# Patient Record
Sex: Female | Born: 1990 | Race: Black or African American | Hispanic: No | Marital: Single | Smoking: Former smoker
Health system: Southern US, Community
[De-identification: ages and names within clinical notes are randomized; demographics above are authoritative.]

## PROBLEM LIST (undated history)

## (undated) DIAGNOSIS — E782 Mixed hyperlipidemia: Secondary | ICD-10-CM

## (undated) DIAGNOSIS — E559 Vitamin D deficiency, unspecified: Secondary | ICD-10-CM

## (undated) DIAGNOSIS — Z9089 Acquired absence of other organs: Secondary | ICD-10-CM

## (undated) HISTORY — DX: Acquired absence of other organs: Z90.89

## (undated) HISTORY — PX: TONSILLECTOMY: SHX5217

---
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
## 2010-06-24 ENCOUNTER — Ambulatory Visit: Payer: Self-pay

## 2011-10-06 ENCOUNTER — Emergency Department: Payer: Self-pay | Admitting: Unknown Physician Specialty

## 2012-05-10 ENCOUNTER — Emergency Department: Payer: Self-pay | Admitting: Emergency Medicine

## 2012-05-10 LAB — URINALYSIS, COMPLETE
Bilirubin,UR: NEGATIVE
Glucose,UR: NEGATIVE mg/dL (ref 0–75)
Nitrite: NEGATIVE
Ph: 6 (ref 4.5–8.0)
Protein: NEGATIVE
RBC,UR: NONE SEEN /HPF (ref 0–5)
Squamous Epithelial: 1

## 2012-05-10 LAB — COMPREHENSIVE METABOLIC PANEL
Alkaline Phosphatase: 56 U/L (ref 50–136)
Bilirubin,Total: 0.1 mg/dL — ABNORMAL LOW (ref 0.2–1.0)
Calcium, Total: 9.1 mg/dL (ref 8.5–10.1)
Chloride: 105 mmol/L (ref 98–107)
Co2: 27 mmol/L (ref 21–32)
Creatinine: 0.79 mg/dL (ref 0.60–1.30)
EGFR (African American): >60
EGFR (Non-African Amer.): >60
SGPT (ALT): 24 U/L
Sodium: 140 mmol/L (ref 136–145)
Total Protein: 7.9 g/dL (ref 6.4–8.2)

## 2012-05-10 LAB — CBC
HCT: 37.4 % (ref 35.0–47.0)
HGB: 12.5 g/dL (ref 12.0–16.0)
MCH: 30.3 pg (ref 26.0–34.0)
RBC: 4.12 10*6/uL (ref 3.80–5.20)
WBC: 6.7 10*3/uL (ref 3.6–11.0)

## 2012-05-10 LAB — HCG, QUANTITATIVE, PREGNANCY: Beta Hcg, Quant.: 25678 m[IU]/mL — ABNORMAL HIGH

## 2012-08-24 ENCOUNTER — Emergency Department: Payer: Self-pay | Admitting: Emergency Medicine

## 2012-08-24 LAB — URINALYSIS, COMPLETE
Bilirubin,UR: NEGATIVE
Glucose,UR: NEGATIVE mg/dL (ref 0–75)
Nitrite: POSITIVE
Protein: 100
RBC,UR: 33 /HPF (ref 0–5)
Specific Gravity: 1.023 (ref 1.003–1.030)
Squamous Epithelial: 14
WBC UR: 1598 /HPF (ref 0–5)

## 2012-08-24 LAB — COMPREHENSIVE METABOLIC PANEL
Albumin: 3 g/dL — ABNORMAL LOW (ref 3.4–5.0)
Alkaline Phosphatase: 70 U/L (ref 50–136)
Bilirubin,Total: 0.5 mg/dL (ref 0.2–1.0)
Calcium, Total: 9.3 mg/dL (ref 8.5–10.1)
Creatinine: 0.73 mg/dL (ref 0.60–1.30)
Glucose: 79 mg/dL (ref 65–99)
Osmolality: 266 (ref 275–301)
Potassium: 3.4 mmol/L — ABNORMAL LOW (ref 3.5–5.1)
SGOT(AST): 49 U/L — ABNORMAL HIGH (ref 15–37)
SGPT (ALT): 63 U/L (ref 12–78)
Sodium: 135 mmol/L — ABNORMAL LOW (ref 136–145)

## 2012-08-24 LAB — CBC WITH DIFFERENTIAL/PLATELET
Eosinophil %: 0.2 %
HCT: 36.6 % (ref 35.0–47.0)
Lymphocyte #: 0.8 10*3/uL — ABNORMAL LOW (ref 1.0–3.6)
Lymphocyte %: 8 %
MCH: 30 pg (ref 26.0–34.0)
MCV: 89 fL (ref 80–100)
Monocyte %: 9.7 %
Neutrophil #: 7.9 10*3/uL — ABNORMAL HIGH (ref 1.4–6.5)
Platelet: 316 10*3/uL (ref 150–440)
RBC: 4.1 10*6/uL (ref 3.80–5.20)
RDW: 13.3 % (ref 11.5–14.5)
WBC: 9.7 10*3/uL (ref 3.6–11.0)

## 2012-08-26 LAB — URINE CULTURE

## 2012-12-01 ENCOUNTER — Observation Stay: Payer: Self-pay | Admitting: Obstetrics and Gynecology

## 2012-12-17 ENCOUNTER — Inpatient Hospital Stay: Payer: Self-pay | Admitting: Obstetrics & Gynecology

## 2012-12-17 LAB — CBC WITH DIFFERENTIAL/PLATELET
Basophil %: 0.3 %
Eosinophil #: 0.1 10*3/uL (ref 0.0–0.7)
Eosinophil %: 1 %
HCT: 36.5 % (ref 35.0–47.0)
Lymphocyte %: 24 %
MCHC: 33.4 g/dL (ref 32.0–36.0)
MCV: 91 fL (ref 80–100)
Monocyte #: 0.7 x10 3/mm (ref 0.2–0.9)
Neutrophil #: 4 10*3/uL (ref 1.4–6.5)
Neutrophil %: 63.8 %
RBC: 4.01 10*6/uL (ref 3.80–5.20)
WBC: 6.2 10*3/uL (ref 3.6–11.0)

## 2015-04-09 ENCOUNTER — Emergency Department
Admission: EM | Admit: 2015-04-09 | Discharge: 2015-04-09 | Disposition: A | Discharge status: Home / Self Care | Payer: Self-pay | Attending: Emergency Medicine | Admitting: Emergency Medicine

## 2015-04-09 DIAGNOSIS — Z792 Long term (current) use of antibiotics: Secondary | ICD-10-CM | POA: Insufficient documentation

## 2015-04-09 DIAGNOSIS — L02413 Cutaneous abscess of right upper limb: Secondary | ICD-10-CM | POA: Insufficient documentation

## 2015-04-09 MED ORDER — SULFAMETHOXAZOLE-TRIMETHOPRIM 800-160 MG PO TABS: 1.0000 | ORAL_TABLET | Freq: Two times a day (BID) | ORAL | Status: DC

## 2015-04-09 MED ORDER — TRAMADOL HCL 50 MG PO TABS: 50.0000 mg | ORAL_TABLET | Freq: Four times a day (QID) | ORAL | Status: AC | PRN

## 2015-04-09 NOTE — ED Notes (Signed)
Pt reports that she noticed an abscess on her right forearm. She thinks that it may have started out as a bug bite. It is open and has clear drainage.

## 2015-04-09 NOTE — ED Provider Notes (Signed)
[facility] Emergency Department Provider Note   ____________________________________________  Time seen: 1210  I have reviewed the triage vital signs and the nursing notes.   HISTORY  Chief Complaint Abscess and Insect Bite     HPI Mariah Watson is a 24 y.o. female who presents to the emergency department with an abscess to the right mid forearm. She reports that it progressively worsened over the week and this morning it opened and has had purulent drainage. She states she has had a subjective fever, chills, and increasing pain in the forearm. She denies previous history of MRSA or other skin infections.   No past medical history on file.  There are no active problems to display for this patient.   No past surgical history on file.  Current Outpatient Rx  Name  Route  Sig  Dispense  Refill  . sulfamethoxazole-trimethoprim (BACTRIM DS) 800-160 MG per tablet   Oral   Take 1 tablet by mouth 2 (two) times daily.   20 tablet   0   . traMADol (ULTRAM) 50 MG tablet   Oral   Take 1 tablet (50 mg total) by mouth every 6 (six) hours as needed.   12 tablet   0     Allergies Shellfish allergy  No family history on file.  Social History History  Substance Use Topics  . Smoking status: Not on file  . Smokeless tobacco: Not on file  . Alcohol Use: Not on file    Review of Systems  Constitutional: Positive for fever. Eyes: Negative for visual changes. ENT: Negative for sore throat. Cardiovascular: Negative for chest pain. Respiratory: Negative for shortness of breath. Gastrointestinal: Negative for abdominal pain, positive for nausea however no vomiting and diarrhea. Genitourinary: Negative for dysuria. Musculoskeletal: Negative for back pain. Skin: Negative for rash. Neurological: Negative for headaches, focal weakness or numbness.  10-point ROS otherwise negative.  ____________________________________________   PHYSICAL  EXAM:  VITAL SIGNS: ED Triage Vitals  Enc Vitals Group     BP 04/09/15 1154 134/74 mmHg     Pulse Rate 04/09/15 1154 87     Resp 04/09/15 1154 18     Temp 04/09/15 1154 99.1 F (37.3 C)     Temp Source 04/09/15 1154 Oral     SpO2 04/09/15 1154 98 %     Weight 04/09/15 1154 230 lb (104.327 kg)     Height 04/09/15 1154 5\' 7"  (1.702 m)     Head Cir --      Peak Flow --      Pain Score 04/09/15 1154 2     Pain Loc --      Pain Edu? --      Excl. in GC? --     Constitutional: Alert and oriented. Well appearing and in no distress. Eyes: Conjunctivae are normal. PERRL. Normal extraocular movements. ENT   Head: Normocephalic and atraumatic.   Nose: No congestion/rhinnorhea.   Mouth/Throat: Mucous membranes are moist.   Neck: No stridor. Respiratory: Normal respiratory effort without tachypnea nor retractions. Musculoskeletal: Full range of motion in all extremities. Neurologic:  Normal speech and language. No gross focal neurologic deficits are appreciated. Speech is normal. No gait instability. Skin: 2 cm in diameter ulceration to the right mid forearm with purulent drainage. . No rash noted. Psychiatric: Mood and affect are normal. Speech and behavior are normal. Patient exhibits appropriate insight and judgment.  ____________________________________________    LABS (pertinent positives/negatives)    ____________________________________________   EKG  ____________________________________________    RADIOLOGY    ____________________________________________   PROCEDURES  Procedure(s) performed: None  Critical Care performed: No  ____________________________________________   INITIAL IMPRESSION / ASSESSMENT AND PLAN / ED COURSE  Pertinent labs & imaging results that were available during my care of the patient were reviewed by me and considered in my medical decision making (see chart for details).  The patient was encouraged to use a warm  compress over the area 4 times a day to encourage drainage. She was encouraged to finish all the antibiotics even if she feels like the area has improved. She was instructed to return to the emergency department in 2 days for symptoms that are not improving with medication. She is to follow up with her primary care provider as needed.  ____________________________________________   FINAL CLINICAL IMPRESSION(S) / ED DIAGNOSES  Final diagnoses:  Abscess of right arm     Mariah PesterCari B Mata Rowen, FNP 04/09/15 1417  Loleta Roseory Forbach, MD 04/09/15 [phone]

## 2015-04-09 NOTE — Discharge Instructions (Signed)
Abscess °An abscess (boil or furuncle) is an infected area on or under the skin. This area is filled with yellowish-white fluid (pus) and other material (debris). °HOME CARE  °· Only take medicines as told by your doctor. °· If you were given antibiotic medicine, take it as directed. Finish the medicine even if you start to feel better. °· If gauze is used, follow your doctor's directions for changing the gauze. °· To avoid spreading the infection: °¨ Keep your abscess covered with a bandage. °¨ Wash your hands well. °¨ Do not share personal care items, towels, or whirlpools with others. °¨ Avoid skin contact with others. °· Keep your skin and clothes clean around the abscess. °· Keep all doctor visits as told. °GET HELP RIGHT AWAY IF:  °· You have more pain, puffiness (swelling), or redness in the wound site. °· You have more fluid or blood coming from the wound site. °· You have muscle aches, chills, or you feel sick. °· You have a fever. °MAKE SURE YOU:  °· Understand these instructions. °· Will watch your condition. °· Will get help right away if you are not doing well or get worse. °Document Released: 05/08/2008 Document Revised: 05/21/2012 Document Reviewed: 02/02/2012 °Exit[employer]® Patient Information ©2015 [employer]. This information is not intended to replace advice given to you by your health care provider. Make sure you discuss any questions you have with your health care provider. ° °

## 2015-04-09 NOTE — ED Notes (Signed)
Pt informed to return if any life threatening symptoms occur.  

## 2015-04-09 NOTE — ED Notes (Signed)
Pt c/o swollen area to the right FA since the beginning of the week, states yesterday it opened and drained.."i think a spider bit me"..Marland Kitchen

## 2015-04-13 NOTE — H&P (Signed)
L&D Evaluation:  History:   HPI 24 yo G1 at 6620w6d gestational age by LMP consistent with a 12 weeks ultrasound whose pregnancy has been uncomplicated.  She noted a gush of "yellow" fluid at 2pm today.  She notes positive fetal movement, denies vaginal bleeding.  She is contracting.   A+, VZI, RI, GBS+, RPR NR    Patient's Medical History No Chronic Illness    Patient's Surgical History tonsillectomy as child    Medications Pre Natal Vitamins    Allergies NKDA    Social History none    Family History Non-Contributory   ROS:   ROS All systems were reviewed.  HEENT, CNS, GI, GU, Respiratory, CV, Renal and Musculoskeletal systems were found to be normal., unless noted in HPI   Exam:   Vital Signs stable  all within normal range    Urine Protein trace    General mild distress with contractions    Mental Status clear    Chest clear    Heart normal sinus rhythm    Abdomen gravid, tender with contractions    Estimated Fetal Weight 7 pounds    Back no CVAT    Edema no edema    Pelvic 4cm per RN    Mebranes Ruptured    Description clear    FHT normal rate with no decels    FHT Description 125/mod var/+Accels/no decels    Ucx 3-4 q 10 min    Other Bedside ultrasound shows fetus in cephalic presentation   Impression:   Impression active labor, SROM   Plan:   Plan EFM/NST, monitor contractions and for cervical change, antibiotics for GBBS prophylaxis, fluids    Comments - Admit for SROM - Will allow to labor without augmentation initially.  Will augment, if necessary - Amp for GBS+ - IVF, clears, cbc   Electronic Signatures: Conard NovakJackson, Kishana Battey D (MD)  (Signed 14-Jan-14 16:52)  Authored: L&D Evaluation   Last Updated: 14-Jan-14 16:52 by Conard NovakJackson, Vaiden Adames D (MD)

## 2015-04-13 NOTE — H&P (Signed)
L&D Evaluation:  History Expanded:   HPI 24yo G1P0  [facility] who presents with a few contractions at home she thought she had a gush and she lost her nmucous plug this morning. she is here to be evaluated.    Gravida 1    Term 0    PreTerm 0    Abortion 0    Living 0    Blood Type (Maternal) A positive    Group B Strep Results Maternal (Result >5wks must be treated as unknown) unknown/result > 5 weeks ago    Maternal HIV Negative    Maternal Syphilis Ab Nonreactive    Maternal Varicella Immune    Rubella Results (Maternal) immune    Maternal T-Dap Immune    [facility]    Presents with contractions    Patient's Medical History No Chronic Illness    Patient's Surgical History none    Social History none    Family History Non-Contributory   ROS:   ROS All systems were reviewed.  HEENT, CNS, GI, GU, Respiratory, CV, Renal and Musculoskeletal systems were found to be normal.   Exam:   Vital Signs stable    Urine Protein not completed    General no apparent distress    Mental Status clear    Chest clear    Heart normal sinus rhythm    Abdomen gravid, non-tender    Estimated Fetal Weight Average for gestational age    Back no CVAT    Edema no edema    Pelvic no external lesions    Ucx absent    Skin dry    Lymph no lymphadenopathy   Impression:   Impression contractions   Plan:   Follow Up Appointment need to schedule   Electronic Signatures: Adria DevonKlett, Carmeline Kowal (MD)  (Signed 29-Dec-13 14:57)  Authored: L&D Evaluation   Last Updated: 29-Dec-13 14:57 by Adria DevonKlett, Ula Couvillon (MD)

## 2015-11-18 ENCOUNTER — Encounter (HOSPITAL_COMMUNITY): Payer: Self-pay | Admitting: Emergency Medicine

## 2015-11-18 ENCOUNTER — Emergency Department (HOSPITAL_COMMUNITY)
Admission: EM | Admit: 2015-11-18 | Discharge: 2015-11-18 | Disposition: A | Discharge status: Home / Self Care | Payer: Self-pay | Attending: Emergency Medicine | Admitting: Emergency Medicine

## 2015-11-18 DIAGNOSIS — N898 Other specified noninflammatory disorders of vagina: Secondary | ICD-10-CM | POA: Insufficient documentation

## 2015-11-18 DIAGNOSIS — Z3202 Encounter for pregnancy test, result negative: Secondary | ICD-10-CM | POA: Insufficient documentation

## 2015-11-18 DIAGNOSIS — F17200 Nicotine dependence, unspecified, uncomplicated: Secondary | ICD-10-CM | POA: Insufficient documentation

## 2015-11-18 DIAGNOSIS — R300 Dysuria: Secondary | ICD-10-CM | POA: Insufficient documentation

## 2015-11-18 LAB — URINE MICROSCOPIC-ADD ON

## 2015-11-18 LAB — URINALYSIS, ROUTINE W REFLEX MICROSCOPIC
Bilirubin Urine: NEGATIVE
Glucose, UA: NEGATIVE mg/dL
Ketones, ur: 15 mg/dL — AB
NITRITE: NEGATIVE
PROTEIN: 30 mg/dL — AB
Specific Gravity, Urine: 1.04 — ABNORMAL HIGH (ref 1.005–1.030)
pH: 7 (ref 5.0–8.0)

## 2015-11-18 LAB — WET PREP, GENITAL
CLUE CELLS WET PREP: NONE SEEN
Sperm: NONE SEEN
Trich, Wet Prep: NONE SEEN
YEAST WET PREP: NONE SEEN

## 2015-11-18 LAB — POC URINE PREG, ED: PREG TEST UR: NEGATIVE

## 2015-11-18 MED ORDER — CEPHALEXIN 500 MG PO CAPS: 500.0000 mg | ORAL_CAPSULE | Freq: Two times a day (BID) | ORAL | Status: DC

## 2015-11-18 NOTE — Discharge Instructions (Signed)

## 2015-11-18 NOTE — ED Provider Notes (Signed)
CSN: [id number]     Arrival date & time 11/18/15  1825 History  By signing my name below, I, Mariah Watson, attest that this documentation has been prepared under the direction and in the presence of non-physician practitioner, Roxy Horseman, PA-C. Electronically Signed: Freida Watson, Scribe. 11/18/2015. 8:37 PM.    Chief Complaint  Patient presents with  . Vaginal Discharge  . Dysuria    The history is provided by the patient. No language interpreter was used.     HPI Comments:  MERLY HINKSON is a 24 y.o. female who presents to the Emergency Department complaining of vaginal discharge for ~ 2 days. Pt reports associated dysuria and mild lower abdominal pain.  She denies h/o similar symptoms, recent new sexual partners, and h/o abdominal surgeries. No alleviating factors noted. Pt is currently on her period.   History reviewed. No pertinent past medical history. History reviewed. No pertinent past surgical history. History reviewed. No pertinent family history. Social History  Substance Use Topics  . Smoking status: Current Every Day Smoker  . Smokeless tobacco: None  . Alcohol Use: Yes   OB History    No data available     Review of Systems  Constitutional: Negative for fever and chills.  Respiratory: Negative for shortness of breath.   Cardiovascular: Negative for chest pain.  Genitourinary: Positive for dysuria and vaginal discharge.    Allergies  Shellfish allergy  Home Medications   Prior to Admission medications   Medication Sig Start Date End Date Taking? Authorizing Provider  acetaminophen (TYLENOL) 500 MG tablet Take 500 mg by mouth every 6 (six) hours as needed for mild pain.   Yes Historical Provider, MD  sulfamethoxazole-trimethoprim (BACTRIM DS) 800-160 MG per tablet Take 1 tablet by mouth 2 (two) times daily. Patient not taking: Reported on 11/18/2015 04/09/15   Chinita Pester, FNP  traMADol (ULTRAM) 50 MG tablet Take 1 tablet (50 mg total) by mouth  every 6 (six) hours as needed. Patient not taking: Reported on 11/18/2015 04/09/15 04/08/16  Cari B Triplett, FNP   BP 136/82 mmHg  Pulse 75  Temp(Src) 98.9 F (37.2 C) (Oral)  Resp 18  SpO2 100% Physical Exam  Constitutional: She is oriented to person, place, and time. She appears well-developed and well-nourished. No distress.  HENT:  Head: Normocephalic and atraumatic.  Eyes: Conjunctivae are normal.  Cardiovascular: Normal rate.   Pulmonary/Chest: Effort normal.  Abdominal: Soft. She exhibits no distension and no mass. There is no tenderness. There is no rebound and no guarding.  Genitourinary:  Pelvic exam chaperoned by female ER tech, no right or left adnexal tenderness, no uterine tenderness, no vaginal discharge, mild bleeding, no CMT or friability, no foreign body, no injury to the external genitalia, no other significant findings    Neurological: She is alert and oriented to person, place, and time.  Skin: Skin is warm and dry.  Psychiatric: She has a normal mood and affect.  Nursing note and vitals reviewed.   ED Course  Procedures   DIAGNOSTIC STUDIES:  Oxygen Saturation is 100% on RA, normal by my interpretation.    COORDINATION OF CARE:  7:43 PM Will order labs. Discussed treatment plan with pt at bedside and pt agreed to plan.    MDM   Final diagnoses:  Dysuria    Wet prep remarkable for white blood cells, no clue cells. Patient is on her period. Low suspicion for STD per patient history. Will treat for UTI with Keflex. Recommend follow-up  with PCP. Patient understands and agrees with the plan. She is stable and ready for discharge.  I personally performed the services described in this documentation, which was scribed in my presence. The recorded information has been reviewed and is accurate.      Roxy HorsemanRobert Mirielle Byrum, PA-C 11/18/15 2119  Benjiman CoreNathan Pickering, MD 11/18/15 2352

## 2015-11-18 NOTE — ED Notes (Signed)
Pt sts yellow vaginal discharge and dysuria; pt sts currently on period

## 2015-11-19 LAB — GC/CHLAMYDIA PROBE AMP (~~LOC~~) NOT AT ARMC
Chlamydia: NEGATIVE
NEISSERIA GONORRHEA: NEGATIVE

## 2017-03-15 DIAGNOSIS — E669 Obesity, unspecified: Secondary | ICD-10-CM | POA: Insufficient documentation

## 2017-03-15 DIAGNOSIS — E010 Iodine-deficiency related diffuse (endemic) goiter: Secondary | ICD-10-CM | POA: Insufficient documentation

## 2018-06-07 ENCOUNTER — Other Ambulatory Visit: Payer: Self-pay

## 2018-06-07 ENCOUNTER — Encounter (HOSPITAL_COMMUNITY): Payer: Self-pay | Admitting: Emergency Medicine

## 2018-06-07 ENCOUNTER — Emergency Department (HOSPITAL_COMMUNITY): Payer: 59

## 2018-06-07 ENCOUNTER — Emergency Department (HOSPITAL_COMMUNITY)
Admission: EM | Admit: 2018-06-07 | Discharge: 2018-06-07 | Disposition: A | Discharge status: Home / Self Care | Payer: 59 | Attending: Emergency Medicine | Admitting: Emergency Medicine

## 2018-06-07 DIAGNOSIS — F17210 Nicotine dependence, cigarettes, uncomplicated: Secondary | ICD-10-CM | POA: Insufficient documentation

## 2018-06-07 DIAGNOSIS — Z79899 Other long term (current) drug therapy: Secondary | ICD-10-CM | POA: Diagnosis not present

## 2018-06-07 DIAGNOSIS — N938 Other specified abnormal uterine and vaginal bleeding: Secondary | ICD-10-CM | POA: Diagnosis not present

## 2018-06-07 DIAGNOSIS — N939 Abnormal uterine and vaginal bleeding, unspecified: Secondary | ICD-10-CM

## 2018-06-07 DIAGNOSIS — R102 Pelvic and perineal pain: Secondary | ICD-10-CM

## 2018-06-07 LAB — CBC WITH DIFFERENTIAL/PLATELET
Abs Immature Granulocytes: 0 10*3/uL (ref 0.0–0.1)
BASOS ABS: 0 10*3/uL (ref 0.0–0.1)
Basophils Relative: 1 %
EOS ABS: 0.1 10*3/uL (ref 0.0–0.7)
Eosinophils Relative: 3 %
HCT: 39.2 % (ref 36.0–46.0)
Hemoglobin: 12.3 g/dL (ref 12.0–15.0)
Immature Granulocytes: 0 %
Lymphocytes Relative: 30 %
Lymphs Abs: 1.6 10*3/uL (ref 0.7–4.0)
MCH: 29.3 pg (ref 26.0–34.0)
MCHC: 31.4 g/dL (ref 30.0–36.0)
MCV: 93.3 fL (ref 78.0–100.0)
MONOS PCT: 6 %
Monocytes Absolute: 0.3 10*3/uL (ref 0.1–1.0)
NEUTROS ABS: 3.3 10*3/uL (ref 1.7–7.7)
Neutrophils Relative %: 60 %
PLATELETS: 395 10*3/uL (ref 150–400)
RBC: 4.2 MIL/uL (ref 3.87–5.11)
RDW: 13.3 % (ref 11.5–15.5)
WBC: 5.4 10*3/uL (ref 4.0–10.5)

## 2018-06-07 LAB — BASIC METABOLIC PANEL
Anion gap: 6 (ref 5–15)
BUN: 6 mg/dL (ref 6–20)
CO2: 28 mmol/L (ref 22–32)
CREATININE: 0.82 mg/dL (ref 0.44–1.00)
Calcium: 9.2 mg/dL (ref 8.9–10.3)
Chloride: 107 mmol/L (ref 98–111)
GFR calc Af Amer: >60 mL/min (ref >60)
GLUCOSE: 100 mg/dL — AB (ref 70–99)
Potassium: 4.3 mmol/L (ref 3.5–5.1)
SODIUM: 141 mmol/L (ref 135–145)

## 2018-06-07 LAB — WET PREP, GENITAL
Clue Cells Wet Prep HPF POC: NONE SEEN
SPERM: NONE SEEN
Trich, Wet Prep: NONE SEEN
Yeast Wet Prep HPF POC: NONE SEEN

## 2018-06-07 LAB — ABO/RH: ABO/RH(D): A POS

## 2018-06-07 LAB — I-STAT BETA HCG BLOOD, ED (MC, WL, AP ONLY): I-stat hCG, quantitative: 84.7 m[IU]/mL — ABNORMAL HIGH (ref <5)

## 2018-06-07 MED ORDER — KETOROLAC TROMETHAMINE 30 MG/ML IJ SOLN
30.0000 mg | Freq: Once | INTRAMUSCULAR | Status: AC
  Administered 2018-06-07: 30 mg via INTRAVENOUS
  Filled 2018-06-07: qty 1

## 2018-06-07 MED ORDER — KETOROLAC TROMETHAMINE 60 MG/2ML IM SOLN
30.0000 mg | Freq: Once | INTRAMUSCULAR | Status: DC
  Filled 2018-06-07: qty 2

## 2018-06-07 NOTE — ED Notes (Signed)
Patient verbalizes understanding of discharge instructions. Opportunity for questioning and answers were provided. Armband removed by staff, pt discharged from ED.  

## 2018-06-07 NOTE — Discharge Instructions (Addendum)
Motrin and Tylenol as needed as directed for pain. Follow-up with OB/GYN, referral given.  Return to the emergency room for fevers, worsening pain, worsening bleeding.

## 2018-06-07 NOTE — ED Triage Notes (Signed)
Patient to ED c/o persistent vaginal bleeding with clots following abortion (pill) on 05/22/18. Patient endorses intermittent lower abdominal cramping (none at this time). States she is still soaking 3 pads per day. States she has been bleeding for 2 weeks.

## 2018-06-07 NOTE — ED Provider Notes (Signed)
Patient placed in Quick Look pathway, seen and evaluated   Chief Complaint: heavy vaginal bleeding  HPI:   [name] is a 27 y.o. G2 P1 who presents to the ED with heavy vaginal bleeding that started 2 weeks ago. Patient reports [facility] [place]  [facility]/[place] for medical abortion and had Cytotec. Patient started bleeding the same day and bleeding was heavy. Patient reports she has continued to bleed since that time with clots. She is using 3 pads per day. Patient was to have follow up this week but did not go. .   ROS: GI: abdominal pain and cramping  GU; Vaginal bleeding.   Physical Exam:  BP 116/63 (BP Location: Right Arm)   Pulse (!) 104   Temp (!) 97.4 F (36.3 C) (Oral)   Resp 16   Ht 5\' 7"  (1.702 m)   Wt 99.8 kg (220 lb)   LMP 04/03/2018 (Exact Date) Comment: recent abortion 6/19  SpO2 99%   BMI 34.46 kg/m    Gen: No distress  Neuro: Awake and Alert  Skin: Warm and dry    Initiation of care has begun. The patient has been counseled on the process, plan, and necessity for staying for the completion/evaluation, and the remainder of the medical screening examination    Janne Napoleoneese, Tiernan Suto M, NP 06/07/18 1442    Jacalyn LefevreHaviland, Julie, MD 06/07/18 1515

## 2018-06-07 NOTE — ED Provider Notes (Signed)
[facility] EMERGENCY DEPARTMENT Provider Note   CSN: [id number] Arrival date & time: 06/07/18  1420     History   Chief Complaint Chief Complaint  Patient presents with  . Vaginal Bleeding    HPI Mariah Watson is a 27 y.o. female.  27 year old female presents with complaint of pelvic cramping and vaginal bleeding.  Patient states that she took Cytotec on May 22, 2018 as prescribed by [facility] to induce an abortion.  Last menstrual cycle Apr 03, 2018, reported to be a normal cycle, G2, P1. [facility] [redacted]w[redacted]d. Patient states that since taking the pill on June 19 she has had daily bleeding with clots and right lower abdominal cramping.  Patient reports using 3 large pads daily.  Patient came to the ER today due to the duration of her bleeding and cramping, symptoms are not any worse today than they have been over the past 3 weeks.  No history of anemia, denies feeling weak, dizzy, lightheaded.  No other complaints or concerns.     History reviewed. No pertinent past medical history.  There are no active problems to display for this patient.   History reviewed. No pertinent surgical history.   OB History   None      Home Medications    Prior to Admission medications   Medication Sig Start Date End Date Taking? Authorizing Provider  acetaminophen (TYLENOL) 500 MG tablet Take 500 mg by mouth every 6 (six) hours as needed for mild pain.   Yes [provider]  ibuprofen (ADVIL,MOTRIN) 800 MG tablet Take 800 mg by mouth every 8 (eight) hours as needed for pain. for pain 05/28/18  Yes [provider]  oxyCODONE-acetaminophen (PERCOCET/ROXICET) 5-325 MG tablet Take 1-2 tablets by mouth every 4 (four) hours as needed. for pain 05/28/18  Yes [provider]  promethazine (PHENERGAN) 25 MG tablet Take 25 mg by mouth every 6 (six) hours as needed for nausea. for nausea 05/28/18  Yes [provider]  cephALEXin (KEFLEX) 500 MG capsule Take 1 capsule (500 mg total) by mouth 2 (two) times daily. Patient not taking: Reported on 06/07/2018 11/18/15   Roxy Horseman, PA-C  sulfamethoxazole-trimethoprim (BACTRIM DS) 800-160 MG per tablet Take 1 tablet by mouth 2 (two) times daily. Patient not taking: Reported on 11/18/2015 04/09/15   Chinita Pester, FNP    Family History No family history on file.  Social History Social History   Tobacco Use  . Smoking status: Current Every Day Smoker  . Smokeless tobacco: Never Used  Substance Use Topics  . Alcohol use: Yes  . Drug use: No     Allergies   Shellfish allergy   Review of Systems Review of Systems  Constitutional: Negative for chills and fever.  Gastrointestinal: Negative for constipation, diarrhea, nausea and vomiting.  Genitourinary: Positive for pelvic pain and vaginal bleeding. Negative for dysuria, frequency and urgency.  Musculoskeletal: Negative for back pain.  Skin: Negative for rash and wound.  Allergic/Immunologic: Negative for immunocompromised state.  Neurological: Negative for dizziness, weakness and light-headedness.  Hematological: Does not bruise/bleed easily.  Psychiatric/Behavioral: Negative for confusion.  All other systems reviewed and are negative.    Physical Exam Updated Vital Signs BP 115/76   Pulse 74   Temp (!) 97.4 F (36.3 C) (Oral)   Resp 14   Ht 5\' 7"  (1.702 m)   Wt 99.8 kg (220 lb)   LMP 04/03/2018 (Exact Date) Comment:  recent abortion 6/19  SpO2 100%   BMI 34.46 kg/m   Physical Exam  Constitutional: She is oriented to person, place, and time. She appears well-developed and well-nourished.  HENT:  Head: Normocephalic and atraumatic.  Cardiovascular: Normal rate, regular rhythm, normal heart sounds and intact distal pulses.  No murmur heard. Pulmonary/Chest: Effort normal and breath sounds normal. No respiratory distress.  Abdominal: Soft. She exhibits no  distension. There is tenderness.  Genitourinary: Cervix exhibits no motion tenderness, no discharge and no friability. Right adnexum displays tenderness. Right adnexum displays no mass and no fullness. Left adnexum displays no mass, no tenderness and no fullness. There is bleeding in the vagina.  Genitourinary Comments: Mild amount of bleeding, os open to fingertip, right adnexa moderately tender. CNA assisted with exam.  Neurological: She is alert and oriented to person, place, and time.  Skin: Skin is warm and dry. Capillary refill takes less than 2 seconds. No rash noted.  Psychiatric: She has a normal mood and affect. Her behavior is normal.  Nursing note and vitals reviewed.    ED Treatments / Results  Labs (all labs ordered are listed, but only abnormal results are displayed) Labs Reviewed  WET PREP, GENITAL - Abnormal; Notable for the following components:      Result Value   WBC, Wet Prep HPF POC MANY (*)    All other components within normal limits  BASIC METABOLIC PANEL - Abnormal; Notable for the following components:   Glucose, Bld 100 (*)    All other components within normal limits  I-STAT BETA HCG BLOOD, ED ([facility], [facility], [facility] ONLY) - Abnormal; Notable for the following components:   I-stat hCG, quantitative 84.7 (*)    All other components within normal limits  CBC WITH DIFFERENTIAL/PLATELET  ABO/RH  GC/CHLAMYDIA PROBE AMP ([place]) NOT AT [facility]    EKG None  Radiology US Pelvic Complete W Transvaginal And Torsion R/o  Result Date: 06/07/2018 CLINICAL DATA:  Initial evaluation for acute pelvic pain. EXAM: TRANSABDOMINAL AND TRANSVAGINAL ULTRASOUND OF PELVIS DOPPLER ULTRASOUND OF OVARIES TECHNIQUE: Both transabdominal and transvaginal ultrasound examinations of the pelvis were performed. Transabdominal technique was performed for global imaging of the pelvis including uterus, ovaries, adnexal regions, and pelvic cul-de-sac. It was necessary to proceed with endovaginal exam  following the transabdominal exam to visualize the uterus, endometrium, and ovaries. Color and duplex Doppler ultrasound was utilized to evaluate blood flow to the ovaries. COMPARISON:  None. FINDINGS: Uterus Measurements: 9.3 x 5.0 x 6.5 cm. No fibroids or other mass visualized. Endometrium Thickness: 9.4 mm.  No focal abnormality visualized. Right ovary Measurements: 3.3 x 1.7 x 1.7 cm. Normal appearance/no adnexal mass. Left ovary Measurements: 3.0 x 1.6 x 2.1 cm. Normal appearance/no adnexal mass. Pulsed Doppler evaluation of both ovaries demonstrates normal low-resistance arterial and venous waveforms. Other findings No abnormal free fluid. IMPRESSION: Normal pelvic ultrasound. No evidence for torsion or other acute abnormality. Electronically Signed   By: Rise Mu M.D.   On: 06/07/2018 16:59    Procedures Procedures (including critical care time)  Medications Ordered in ED Medications  ketorolac (TORADOL) 30 MG/ML injection 30 mg (30 mg Intravenous Given 06/07/18 1930)     Initial Impression / Assessment and Plan / ED Course  I have reviewed the triage vital signs and the nursing notes.  Pertinent labs & imaging results that were available during my care of the patient were reviewed by me and considered in my medical decision making (see chart for details).  Clinical Course as of Jun 07 1941  Fri Jun 07, 2018  1842 Discussed case with Dr. Shawnie PonsPratt, OB/GYN on call, reviewed US report including endometrium 9.624mm, quant 84.7. Per Dr. Shawnie PonsPratt, this is normal, patient can follow up outpatient.   [LM]  [id number] 27 year old female presents with complaint of pelvic cramping, more so on the right side with vaginal bleeding x3 weeks after taking Cytotec 2 induce an abortion at 6 weeks and 7 days pregnant per patient. On exam, right adnexal tenderness, os open to finger tip, mild amount of bleeding. CBC is normal- no anemia, CMP normal, quant elevated at 84.7. Rh pending. US is unremarkable.  Discussed with Ob, patient may be dc to follow up outpatient. Patient will be held until Rh is complete.    [LM]    Clinical Course User Index [LM] Jeannie FendMurphy, Laura A, PA-C    Final Clinical Impressions(s) / ED Diagnoses   Final diagnoses:  Pelvic pain  Vaginal bleeding    ED Discharge Orders    None       Jeannie FendMurphy, Laura A, PA-C 06/07/18 1942    Terrilee FilesButler, Michael C, MD 06/08/18 [phone]

## 2018-06-07 NOTE — ED Notes (Signed)
Patient transported to Ultrasound 

## 2018-06-10 LAB — GC/CHLAMYDIA PROBE AMP (~~LOC~~) NOT AT ARMC
Chlamydia: NEGATIVE
NEISSERIA GONORRHEA: NEGATIVE

## 2018-11-30 ENCOUNTER — Emergency Department: Payer: 59

## 2018-11-30 ENCOUNTER — Encounter: Payer: Self-pay | Admitting: Emergency Medicine

## 2018-11-30 ENCOUNTER — Emergency Department
Admission: EM | Admit: 2018-11-30 | Discharge: 2018-11-30 | Disposition: A | Discharge status: Home / Self Care | Payer: 59 | Attending: Emergency Medicine | Admitting: Emergency Medicine

## 2018-11-30 ENCOUNTER — Other Ambulatory Visit: Payer: Self-pay

## 2018-11-30 DIAGNOSIS — R1013 Epigastric pain: Secondary | ICD-10-CM | POA: Diagnosis present

## 2018-11-30 DIAGNOSIS — K2900 Acute gastritis without bleeding: Secondary | ICD-10-CM

## 2018-11-30 DIAGNOSIS — F17200 Nicotine dependence, unspecified, uncomplicated: Secondary | ICD-10-CM | POA: Insufficient documentation

## 2018-11-30 DIAGNOSIS — R110 Nausea: Secondary | ICD-10-CM | POA: Insufficient documentation

## 2018-11-30 LAB — COMPREHENSIVE METABOLIC PANEL
ALT: 19 U/L (ref 0–44)
AST: 21 U/L (ref 15–41)
Albumin: 4.4 g/dL (ref 3.5–5.0)
Alkaline Phosphatase: 39 U/L (ref 38–126)
Anion gap: 6 (ref 5–15)
BUN: 10 mg/dL (ref 6–20)
CO2: 24 mmol/L (ref 22–32)
Calcium: 8.9 mg/dL (ref 8.9–10.3)
Chloride: 106 mmol/L (ref 98–111)
Creatinine, Ser: 0.74 mg/dL (ref 0.44–1.00)
GFR calc Af Amer: >60 mL/min (ref >60)
GFR calc non Af Amer: >60 mL/min (ref >60)
Glucose, Bld: 86 mg/dL (ref 70–99)
POTASSIUM: 3.7 mmol/L (ref 3.5–5.1)
Sodium: 136 mmol/L (ref 135–145)
Total Bilirubin: 0.7 mg/dL (ref 0.3–1.2)
Total Protein: 7.8 g/dL (ref 6.5–8.1)

## 2018-11-30 LAB — URINALYSIS, COMPLETE (UACMP) WITH MICROSCOPIC
Bacteria, UA: NONE SEEN
Bilirubin Urine: NEGATIVE
Glucose, UA: NEGATIVE mg/dL
Hgb urine dipstick: NEGATIVE
Ketones, ur: NEGATIVE mg/dL
Nitrite: NEGATIVE
Protein, ur: NEGATIVE mg/dL
Specific Gravity, Urine: 1.019 (ref 1.005–1.030)
pH: 7 (ref 5.0–8.0)

## 2018-11-30 LAB — CBC
HCT: 41.4 % (ref 36.0–46.0)
Hemoglobin: 13.1 g/dL (ref 12.0–15.0)
MCH: 28.8 pg (ref 26.0–34.0)
MCHC: 31.6 g/dL (ref 30.0–36.0)
MCV: 91 fL (ref 80.0–100.0)
Platelets: 341 10*3/uL (ref 150–400)
RBC: 4.55 MIL/uL (ref 3.87–5.11)
RDW: 12.7 % (ref 11.5–15.5)
WBC: 3.4 10*3/uL — AB (ref 4.0–10.5)
nRBC: 0 % (ref 0.0–0.2)

## 2018-11-30 LAB — LIPASE, BLOOD: Lipase: 28 U/L (ref 11–51)

## 2018-11-30 LAB — PREGNANCY, URINE: Preg Test, Ur: NEGATIVE

## 2018-11-30 MED ORDER — ALUMINUM-MAGNESIUM-SIMETHICONE 200-200-20 MG/5ML PO SUSP: 30.0000 mL | Freq: Three times a day (TID) | ORAL | 0 refills | Status: DC

## 2018-11-30 MED ORDER — FAMOTIDINE 20 MG PO TABS: 20.0000 mg | ORAL_TABLET | Freq: Two times a day (BID) | ORAL | 0 refills | Status: DC

## 2018-11-30 NOTE — Discharge Instructions (Signed)
Your lab tests and CT scan today are all okay.  We do not see any acute issues to explain your symptoms.  Try taking Maalox and Pepcid to help soothe your stomach and decrease stomach acid.  If symptoms are not resolved, please follow-up with primary care for continued evaluation of your symptoms.

## 2018-11-30 NOTE — ED Triage Notes (Signed)
C/O abdominal pain x 2-3 weeks.  States pain is intermittent, worse at 0100.  AAOx3.  Skin warm and dry. NAD

## 2018-11-30 NOTE — ED Notes (Signed)
Lab notified to add on urine pregnancy test. [facility].

## 2018-11-30 NOTE — ED Notes (Signed)
Pt transported to CT ?

## 2018-11-30 NOTE — ED Provider Notes (Signed)
[facility] Emergency Department Provider Note  ____________________________________________  Time seen: Approximately 12:16 PM  I have reviewed the triage vital signs and the nursing notes.   HISTORY  Chief Complaint Abdominal Pain    HPI Mariah Watson is a 27 y.o. female who complains of abdominal pain for the past 3 weeks, intermittent without aggravating or alleviating factors, mostly epigastric.  Nonradiating.  Not affected by eating.  Moderate intensity, aching.  No vomiting.  Has some nausea.  No constipation or diarrhea.  Tried laxatives without relief.  Denies any urinary symptoms.  No unusual vaginal bleeding or discharge.  Denies chest pain or shortness of breath fevers chills or sweats.      History reviewed. No pertinent past medical history.   There are no active problems to display for this patient.    History reviewed. No pertinent surgical history.   Prior to Admission medications   Medication Sig Start Date End Date Taking? Authorizing Provider  acetaminophen (TYLENOL) 500 MG tablet Take 500 mg by mouth every 6 (six) hours as needed for mild pain.    [provider]  aluminum-magnesium hydroxide-simethicone (MAALOX) 200-200-20 MG/5ML SUSP Take 30 mLs by mouth 4 (four) times daily -  before meals and at bedtime. 11/30/18   Sharman Cheek, MD  cephALEXin (KEFLEX) 500 MG capsule Take 1 capsule (500 mg total) by mouth 2 (two) times daily. Patient not taking: Reported on 06/07/2018 11/18/15   Roxy Horseman, PA-C  famotidine (PEPCID) 20 MG tablet Take 1 tablet (20 mg total) by mouth 2 (two) times daily. 11/30/18   Sharman Cheek, MD  ibuprofen (ADVIL,MOTRIN) 800 MG tablet Take 800 mg by mouth every 8 (eight) hours as needed for pain. for pain 05/28/18   [provider]  oxyCODONE-acetaminophen (PERCOCET/ROXICET) 5-325 MG tablet Take 1-2 tablets by mouth every 4 (four) hours as needed. for pain 05/28/18   [provider]  promethazine (PHENERGAN) 25 MG tablet Take 25 mg by mouth every 6 (six) hours as needed for nausea. for nausea 05/28/18   [provider]  sulfamethoxazole-trimethoprim (BACTRIM DS) 800-160 MG per tablet Take 1 tablet by mouth 2 (two) times daily. Patient not taking: Reported on 11/18/2015 04/09/15   Chinita Pester, FNP     Allergies Shellfish allergy   No family history on file.  Social History Social History   Tobacco Use  . Smoking status: Current Every Day Smoker  . Smokeless tobacco: Never Used  Substance Use Topics  . Alcohol use: Yes  . Drug use: No    Review of Systems  Constitutional:   No fever or chills.  ENT:   No sore throat. No rhinorrhea. Cardiovascular:   No chest pain or syncope. Respiratory:   No dyspnea or cough. Gastrointestinal: Positive as above for abdominal pain without vomiting and diarrhea.  Musculoskeletal:   Negative for focal pain or swelling All other systems reviewed and are negative except as documented above in ROS and HPI.  ____________________________________________   PHYSICAL EXAM:  VITAL SIGNS: ED Triage Vitals  Enc Vitals Group     BP 11/30/18 0950 120/79     Pulse Rate 11/30/18 0950 83     Resp 11/30/18 0950 16     Temp 11/30/18 0950 98.8 F (37.1 C)     Temp Source 11/30/18 0950 Oral     SpO2 11/30/18 0950 100 %     Weight 11/30/18 0903 206 lb (93.4 kg)     Height 11/30/18 0903  5\' 7"  (1.702 m)     Head Circumference --      Peak Flow --      Pain Score --      Pain Loc --      Pain Edu? --      Excl. in GC? --     Vital signs reviewed, nursing assessments reviewed.   Constitutional:   Alert and oriented. Non-toxic appearance. Eyes:   Conjunctivae are normal. EOMI. PERRL. ENT      Head:   Normocephalic and atraumatic.      Nose:   No congestion/rhinnorhea.       Mouth/Throat:   MMM, no pharyngeal erythema. No peritonsillar mass.       Neck:   No meningismus. Full  ROM. Hematological/Lymphatic/Immunilogical:   No cervical lymphadenopathy. Cardiovascular:   RRR. Symmetric bilateral radial and DP pulses.  No murmurs. Cap refill less than 2 seconds. Respiratory:   Normal respiratory effort without tachypnea/retractions. Breath sounds are clear and equal bilaterally. No wheezes/rales/rhonchi. Gastrointestinal:   Soft with mild epigastric and left upper quadrant tenderness. Non distended. There is no CVA tenderness.  No rebound, rigidity, or guarding. Musculoskeletal:   Normal range of motion in all extremities. No joint effusions.  No lower extremity tenderness.  No edema. Neurologic:   Normal speech and language.  Motor grossly intact. No acute focal neurologic deficits are appreciated.  Skin:    Skin is warm, dry and intact. No rash noted.  No petechiae, purpura, or bullae.  ____________________________________________    LABS (pertinent positives/negatives) (all labs ordered are listed, but only abnormal results are displayed) Labs Reviewed  CBC - Abnormal; Notable for the following components:      Result Value   WBC 3.4 (*)    All other components within normal limits  URINALYSIS, COMPLETE (UACMP) WITH MICROSCOPIC - Abnormal; Notable for the following components:   Color, Urine YELLOW (*)    APPearance HAZY (*)    Leukocytes, UA LARGE (*)    All other components within normal limits  URINE CULTURE  LIPASE, BLOOD  COMPREHENSIVE METABOLIC PANEL  PREGNANCY, URINE   ____________________________________________   EKG    ____________________________________________    RADIOLOGY  Ct Renal Stone Study  Result Date: 11/30/2018 CLINICAL DATA:  27 year old female with history of intermittent abdominal pain for the past 2-3 weeks. Some associated nausea. EXAM: CT ABDOMEN AND PELVIS WITHOUT CONTRAST TECHNIQUE: Multidetector CT imaging of the abdomen and pelvis was performed following the standard protocol without IV contrast. COMPARISON:   None. FINDINGS: Lower chest: Unremarkable. Hepatobiliary: No definite suspicious cystic or solid hepatic lesions are confidently identified on today's noncontrast CT examination. Unenhanced appearance of the gallbladder is normal. Pancreas: No definite pancreatic mass or peripancreatic fluid or inflammatory changes are noted on today's noncontrast CT examination. Spleen: Unremarkable. Adrenals/Urinary Tract: There are no abnormal calcifications within the collecting system of either kidney, along the course of either ureter, or within the lumen of the urinary bladder. No hydroureteronephrosis or perinephric stranding to suggest urinary tract obstruction at this time. The unenhanced appearance of the kidneys is unremarkable bilaterally. Unenhanced appearance of the urinary bladder is normal. Bilateral adrenal glands are normal in appearance. Stomach/Bowel: Unenhanced appearance of the stomach is normal. No pathologic dilatation of small bowel or colon. Normal appendix. Vascular/Lymphatic: No atherosclerotic calcifications in the abdominal aorta or pelvic vasculature. No lymphadenopathy noted in the abdomen or pelvis. Reproductive: Unenhanced appearance of the uterus and ovaries is unremarkable in appearance. Other: Trace volume of  free fluid in the low anatomic pelvis, likely physiologic in this young female patient. No larger volume of ascites. No pneumoperitoneum. Musculoskeletal: There are no aggressive appearing lytic or blastic lesions noted in the visualized portions of the skeleton. IMPRESSION: 1. No acute findings are noted in the abdomen or pelvis to account for the patient's symptoms. 2. Normal appendix. 3. Trace volume of free fluid in the low anatomic pelvis, presumably physiologic in this young female patient. Electronically Signed   By: Trudie Reedaniel  Entrikin M.D.   On: 11/30/2018 11:33     ____________________________________________   PROCEDURES Procedures  ____________________________________________  DIFFERENTIAL DIAGNOSIS   Pancreatitis, gastritis.  CLINICAL IMPRESSION / ASSESSMENT AND PLAN / ED COURSE  Pertinent labs & imaging results that were available during my care of the patient were reviewed by me and considered in my medical decision making (see chart for details).    Patient presents with subacute upper abdominal pain.  Vital signs are normal.  Exam shows some mild upper tenderness.Considering the patient's symptoms, medical history, and physical examination today, I have low suspicion for cholecystitis or biliary pathology, perforation or bowel obstruction, hernia, intra-abdominal abscess, AAA or dissection, volvulus or intussusception, mesenteric ischemia, or appendicitis.  Given her ongoing symptoms, obtain a CT scan which is negative.  In conjunction with her normal vital signs and normal labs, I think this is most likely gastritis/GERD which can be treated empirically with Maalox and Pepcid.  Recommended she follow-up with primary care.  Urinalysis shows some inflammatory changes but in the absence of urinary symptoms I will defer antibiotics for now and send a urine culture.  Doubt STI PID TOA or torsion.      ____________________________________________   FINAL CLINICAL IMPRESSION(S) / ED DIAGNOSES    Final diagnoses:  Epigastric pain  Acute gastritis without hemorrhage, unspecified gastritis type     ED Discharge Orders         Ordered    aluminum-magnesium hydroxide-simethicone (MAALOX) 200-200-20 MG/5ML SUSP  3 times daily before meals & bedtime     11/30/18 1215    famotidine (PEPCID) 20 MG tablet  2 times daily     11/30/18 1215          Portions of this note were generated with dragon dictation software. Dictation errors may occur despite best attempts at proofreading.   Sharman CheekStafford, Quintavius Niebuhr, MD 11/30/18 1220

## 2018-12-01 LAB — URINE CULTURE: Culture: <10000 — AB

## 2019-10-03 ENCOUNTER — Emergency Department: Payer: 59

## 2019-10-03 ENCOUNTER — Emergency Department
Admission: EM | Admit: 2019-10-03 | Discharge: 2019-10-04 | Disposition: A | Discharge status: Home / Self Care | Payer: 59 | Attending: Emergency Medicine | Admitting: Emergency Medicine

## 2019-10-03 ENCOUNTER — Other Ambulatory Visit: Payer: Self-pay

## 2019-10-03 DIAGNOSIS — F17200 Nicotine dependence, unspecified, uncomplicated: Secondary | ICD-10-CM | POA: Diagnosis not present

## 2019-10-03 DIAGNOSIS — Z20828 Contact with and (suspected) exposure to other viral communicable diseases: Secondary | ICD-10-CM | POA: Diagnosis not present

## 2019-10-03 DIAGNOSIS — J019 Acute sinusitis, unspecified: Secondary | ICD-10-CM | POA: Insufficient documentation

## 2019-10-03 DIAGNOSIS — R42 Dizziness and giddiness: Secondary | ICD-10-CM | POA: Diagnosis present

## 2019-10-03 DIAGNOSIS — R0602 Shortness of breath: Secondary | ICD-10-CM

## 2019-10-03 LAB — CBC
HCT: 35.5 % — ABNORMAL LOW (ref 36.0–46.0)
Hemoglobin: 11.3 g/dL — ABNORMAL LOW (ref 12.0–15.0)
MCH: 29.4 pg (ref 26.0–34.0)
MCHC: 31.8 g/dL (ref 30.0–36.0)
MCV: 92.4 fL (ref 80.0–100.0)
Platelets: 279 10*3/uL (ref 150–400)
RBC: 3.84 MIL/uL — ABNORMAL LOW (ref 3.87–5.11)
RDW: 12.8 % (ref 11.5–15.5)
WBC: 5 10*3/uL (ref 4.0–10.5)
nRBC: 0 % (ref 0.0–0.2)

## 2019-10-03 LAB — BASIC METABOLIC PANEL
Anion gap: 6 (ref 5–15)
BUN: 13 mg/dL (ref 6–20)
CO2: 26 mmol/L (ref 22–32)
Calcium: 8.9 mg/dL (ref 8.9–10.3)
Chloride: 105 mmol/L (ref 98–111)
Creatinine, Ser: 0.78 mg/dL (ref 0.44–1.00)
GFR calc Af Amer: >60 mL/min (ref >60)
GFR calc non Af Amer: >60 mL/min (ref >60)
Glucose, Bld: 93 mg/dL (ref 70–99)
Potassium: 3.9 mmol/L (ref 3.5–5.1)
Sodium: 137 mmol/L (ref 135–145)

## 2019-10-03 MED ORDER — DROPERIDOL 2.5 MG/ML IJ SOLN
2.5000 mg | Freq: Once | INTRAMUSCULAR | Status: AC
  Administered 2019-10-04: 2.5 mg via INTRAVENOUS
  Filled 2019-10-03: qty 2

## 2019-10-03 MED ORDER — SODIUM CHLORIDE 0.9 % IV BOLUS
1000.0000 mL | Freq: Once | INTRAVENOUS | Status: AC
  Administered 2019-10-04: 1000 mL via INTRAVENOUS

## 2019-10-03 NOTE — ED Triage Notes (Signed)
Patient c/o SOB, lightheadedness, and headache X 3 days.

## 2019-10-03 NOTE — ED Provider Notes (Signed)
[facility] Emergency Department Provider Note   ____________________________________________   First MD Initiated Contact with Patient 10/03/19 2316     (approximate)  I have reviewed the triage vital signs and the nursing notes.   HISTORY  Chief Complaint Shortness of Breath and Headache    HPI Mariah Watson is a 28 y.o. female who presents to the ED from home with a chief complaint of shortness of breath, sore throat, frontal headache and dizziness.  Patient reports a 1 week history of mild cough associated with shortness of breath.  3 days of frontal headache unrelieved by over-the-counter medications.  Feeling dizzy at times.  Denies fever, chills, chest pain, abdominal pain, nausea, vomiting or diarrhea.  Denies recent travel, trauma or hormone use.     Past medical history None  There are no active problems to display for this patient.   History reviewed. No pertinent surgical history.  Prior to Admission medications   Medication Sig Start Date End Date Taking? Authorizing Provider  acetaminophen (TYLENOL) 500 MG tablet Take 500 mg by mouth every 6 (six) hours as needed for mild pain.    [provider]  aluminum-magnesium hydroxide-simethicone (MAALOX) 200-200-20 MG/5ML SUSP Take 30 mLs by mouth 4 (four) times daily -  before meals and at bedtime. 11/30/18   Carrie Mew, MD  cephALEXin (KEFLEX) 500 MG capsule Take 1 capsule (500 mg total) by mouth 2 (two) times daily. Patient not taking: Reported on 06/07/2018 11/18/15   Montine Circle, PA-C  famotidine (PEPCID) 20 MG tablet Take 1 tablet (20 mg total) by mouth 2 (two) times daily. 11/30/18   Carrie Mew, MD  ibuprofen (ADVIL,MOTRIN) 800 MG tablet Take 800 mg by mouth every 8 (eight) hours as needed for pain. for pain 05/28/18   [provider]  oxyCODONE-acetaminophen (PERCOCET/ROXICET) 5-325 MG tablet Take 1-2 tablets by mouth every 4 (four) hours as needed. for  pain 05/28/18   [provider]  promethazine (PHENERGAN) 25 MG tablet Take 25 mg by mouth every 6 (six) hours as needed for nausea. for nausea 05/28/18   [provider]  sulfamethoxazole-trimethoprim (BACTRIM DS) 800-160 MG per tablet Take 1 tablet by mouth 2 (two) times daily. Patient not taking: Reported on 11/18/2015 04/09/15   Victorino Dike, FNP    Allergies Shellfish allergy  No family history on file.  Social History Social History   Tobacco Use  . Smoking status: Current Every Day Smoker  . Smokeless tobacco: Never Used  Substance Use Topics  . Alcohol use: Yes  . Drug use: No    Review of Systems  Constitutional: No fever/chills Eyes: No visual changes. ENT: Positive for sore throat. Cardiovascular: Denies chest pain. Respiratory: Positive for cough and shortness of breath. Gastrointestinal: No abdominal pain.  No nausea, no vomiting.  No diarrhea.  No constipation. Genitourinary: Negative for dysuria. Musculoskeletal: Negative for back pain. Skin: Negative for rash. Neurological: Positive for headache.  Negative for focal weakness or numbness.   ____________________________________________   PHYSICAL EXAM:  VITAL SIGNS: ED Triage Vitals  Enc Vitals Group     BP 10/03/19 2013 133/85     Pulse Rate 10/03/19 2013 69     Resp 10/03/19 2013 18     Temp 10/03/19 2013 97.8 F (36.6 C)     Temp src --      SpO2 10/03/19 2013 100 %     Weight 10/03/19 2014 200 lb (90.7 kg)     Height  10/03/19 2014 5\' 7"  (1.702 m)     Head Circumference --      Peak Flow --      Pain Score 10/03/19 2013 8     Pain Loc --      Pain Edu? --      Excl. in GC? --     Constitutional: Alert and oriented. Well appearing and in no acute distress. Eyes: Conjunctivae are normal. PERRL. EOMI. Head: Atraumatic. Nose: No congestion/rhinnorhea. Mouth/Throat: Mucous membranes are moist.  Oropharynx moderately erythematous without tonsillar swelling, exudates or  peritonsillar abscess. Neck: No stridor.  Supple neck without meningismus. Cardiovascular: Normal rate, regular rhythm. Grossly normal heart sounds.  Good peripheral circulation. Respiratory: Normal respiratory effort.  No retractions. Lungs CTAB. Gastrointestinal: Soft and nontender. No distention. No abdominal bruits. No CVA tenderness. Musculoskeletal: No lower extremity tenderness nor edema.  No joint effusions. Neurologic:  Normal speech and language. No gross focal neurologic deficits are appreciated. No gait instability. Skin:  Skin is warm, dry and intact. No rash noted. Psychiatric: Mood and affect are normal. Speech and behavior are normal.  ____________________________________________   LABS (all labs ordered are listed, but only abnormal results are displayed)  Labs Reviewed  CBC - Abnormal; Notable for the following components:      Result Value   RBC 3.84 (*)    Hemoglobin 11.3 (*)    HCT 35.5 (*)    All other components within normal limits  GROUP A STREP BY PCR  SARS CORONAVIRUS 2 (TAT 6-24 HRS)  BASIC METABOLIC PANEL  POC URINE PREG, ED  TROPONIN I (HIGH SENSITIVITY)  TROPONIN I (HIGH SENSITIVITY)   ____________________________________________  EKG  ED ECG REPORT I, , J, the attending physician, personally viewed and interpreted this ECG.   Date: 10/04/2019  EKG Time: 2020  Rate: 70  Rhythm: normal EKG, normal sinus rhythm  Axis: Normal  Intervals:none  ST&T Change: Nonspecific  ____________________________________________  RADIOLOGY  ED MD interpretation:  No acute cardiopulmonary process; no ICH, paranasal sinus disease  Official radiology report(s): Ct Head Wo Contrast  Result Date: 10/04/2019 CLINICAL DATA:  28 year old female with shortness of breath and lightheadedness. EXAM: CT HEAD WITHOUT CONTRAST TECHNIQUE: Contiguous axial images were obtained from the base of the skull through the vertex without intravenous contrast.  COMPARISON:  None. FINDINGS: Brain: No evidence of acute infarction, hemorrhage, hydrocephalus, extra-axial collection or mass lesion/mass effect. Vascular: No hyperdense vessel or unexpected calcification. Skull: Normal. Negative for fracture or focal lesion. Sinuses/Orbits: There is diffuse mucoperiosteal thickening of paranasal sinuses. No air-fluid level. The mastoid air cells are clear. Other: None IMPRESSION: 1. Normal unenhanced CT of the brain. 2. Paranasal sinus disease. Electronically Signed   By: 26 M.D.   On: 10/04/2019 02:22   Dg Chest Port 1 View  Result Date: 10/03/2019 CLINICAL DATA:  Shortness of breath, lightheadedness and headache for 3 days EXAM: PORTABLE CHEST 1 VIEW COMPARISON:  Radiograph Apr 26, 2006 FINDINGS: No consolidation, features of edema, pneumothorax, or effusion. Pulmonary vascularity is normally distributed. The cardiomediastinal contours are unremarkable. No acute osseous or soft tissue abnormality. IMPRESSION: No acute cardiopulmonary abnormality. Electronically Signed   By: Apr 28, 2006 M.D.   On: 10/03/2019 22:35    ____________________________________________   PROCEDURES  Procedure(s) performed (including Critical Care):  Procedures   ____________________________________________   INITIAL IMPRESSION / ASSESSMENT AND PLAN / ED COURSE  As part of my medical decision making, I reviewed the following data within the electronic MEDICAL RECORD NUMBER  Nursing notes reviewed and incorporated, Labs reviewed, EKG interpreted, Old chart reviewed, Radiograph reviewed and Notes from prior ED visits     Mariah Watson was evaluated in Emergency Department on 10/04/2019 for the symptoms described in the history of present illness. She was evaluated in the context of the global COVID-19 pandemic, which necessitated consideration that the patient might be at risk for infection with the SARS-CoV-2 virus that causes COVID-19. Institutional protocols and  algorithms that pertain to the evaluation of patients at risk for COVID-19 are in a state of rapid change based on information released by regulatory bodies including the CDC and federal and state organizations. These policies and algorithms were followed during the patient's care in the ED.    28 year old female who presents with a 1 week history of shortness of breath, mild cough, sore throat, headache and dizziness. Differential diagnosis includes but is not limited to viral process such as Covid-19, CAP, strep pharyngitis, dehydration, etc.  Laboratory results unremarkable.  Will check troponin, CT head.  Administer IV fluids, low-dose droperidol for headache and reassess.  Will obtain send out Covid swab.   Clinical Course as of Oct 03 240  Sat Oct 04, 2019  0239 Patient is feeling much better.  Updated her on all test results.  Will put her on Augmentin for sinusitis.  Strict return precautions given.  Patient verbalizes understanding agrees with plan of care.   [JS]    Clinical Course User Index [JS] Irean HongSung,  J, MD     ____________________________________________   FINAL CLINICAL IMPRESSION(S) / ED DIAGNOSES  Final diagnoses:  Acute non-recurrent sinusitis, unspecified location     ED Discharge Orders    None       Note:  This document was prepared using Dragon voice recognition software and may include unintentional dictation errors.   Irean HongSung,  J, MD 10/04/19 [phone]

## 2019-10-03 NOTE — ED Notes (Signed)
Pt provided warm blanket at this time  

## 2019-10-04 ENCOUNTER — Emergency Department: Payer: 59

## 2019-10-04 LAB — TROPONIN I (HIGH SENSITIVITY): Troponin I (High Sensitivity): 4 ng/L (ref <18)

## 2019-10-04 LAB — SARS CORONAVIRUS 2 (TAT 6-24 HRS): SARS Coronavirus 2: NEGATIVE

## 2019-10-04 LAB — GROUP A STREP BY PCR: Group A Strep by PCR: NOT DETECTED

## 2019-10-04 MED ORDER — AMOXICILLIN-POT CLAVULANATE 875-125 MG PO TABS
1.0000 | ORAL_TABLET | Freq: Once | ORAL | Status: AC
  Administered 2019-10-04: 1 via ORAL
  Filled 2019-10-04: qty 1

## 2019-10-04 MED ORDER — AMOXICILLIN-POT CLAVULANATE 875-125 MG PO TABS: 1.0000 | ORAL_TABLET | Freq: Two times a day (BID) | ORAL | 0 refills | Status: DC

## 2019-10-04 NOTE — ED Notes (Signed)
Pt to CT at this time.

## 2019-10-04 NOTE — Discharge Instructions (Signed)
1.  Please quarantine yourself until your COVID-19 results come back, generally in 24 hours.  2.  Take antibiotic as prescribed (Augmentin 875 mg twice daily x7 days). 3.  Return to the ER for worsening symptoms, persistent vomiting, difficulty breathing or other concerns

## 2019-11-21 ENCOUNTER — Other Ambulatory Visit: Payer: 59

## 2020-07-19 NOTE — Progress Notes (Deleted)
Patient, No Pcp Per   No chief complaint on file.   HPI:       [name] is a 29 y.o. No obstetric history on file. whose LMP was No LMP recorded. (Menstrual status: Other)., presents today for NP eval of irreg menses    No past medical history on file.  No past surgical history on file.  No family history on file.  Social History   Socioeconomic History  . Marital status: Single    Spouse name: Not on file  . Number of children: Not on file  . Years of education: Not on file  . Highest education level: Not on file  Occupational History  . Not on file  Tobacco Use  . Smoking status: Current Every Day Smoker  . Smokeless tobacco: Never Used  Substance and Sexual Activity  . Alcohol use: Yes  . Drug use: No  . Sexual activity: Not on file  Other Topics Concern  . Not on file  Social History Narrative  . Not on file   Social Determinants of Health   Financial Resource Strain:   . Difficulty of Paying Living Expenses:   Food Insecurity:   . Worried About Programme researcher, broadcasting/film/video in the Last Year:   . Barista in the Last Year:   Transportation Needs:   . Freight forwarder (Medical):   Marland Kitchen Lack of Transportation (Non-Medical):   Physical Activity:   . Days of Exercise per Week:   . Minutes of Exercise per Session:   Stress:   . Feeling of Stress :   Social Connections:   . Frequency of Communication with Friends and Family:   . Frequency of Social Gatherings with Friends and Family:   . Attends Religious Services:   . Active Member of Clubs or Organizations:   . Attends Banker Meetings:   Marland Kitchen Marital Status:   Intimate Partner Violence:   . Fear of Current or Ex-Partner:   . Emotionally Abused:   Marland Kitchen Physically Abused:   . Sexually Abused:     Outpatient Medications Prior to Visit  Medication Sig Dispense Refill  . acetaminophen (TYLENOL) 500 MG tablet Take 500 mg by mouth every 6 (six) hours as needed for mild pain.      Marland Kitchen aluminum-magnesium hydroxide-simethicone (MAALOX) 200-200-20 MG/5ML SUSP Take 30 mLs by mouth 4 (four) times daily -  before meals and at bedtime. 355 mL 0  . amoxicillin-clavulanate (AUGMENTIN) 875-125 MG tablet Take 1 tablet by mouth 2 (two) times daily. 14 tablet 0  . cephALEXin (KEFLEX) 500 MG capsule Take 1 capsule (500 mg total) by mouth 2 (two) times daily. (Patient not taking: Reported on 06/07/2018) 14 capsule 0  . famotidine (PEPCID) 20 MG tablet Take 1 tablet (20 mg total) by mouth 2 (two) times daily. 60 tablet 0  . ibuprofen (ADVIL,MOTRIN) 800 MG tablet Take 800 mg by mouth every 8 (eight) hours as needed for pain. for pain  0  . oxyCODONE-acetaminophen (PERCOCET/ROXICET) 5-325 MG tablet Take 1-2 tablets by mouth every 4 (four) hours as needed. for pain  0  . promethazine (PHENERGAN) 25 MG tablet Take 25 mg by mouth every 6 (six) hours as needed for nausea. for nausea  0  . sulfamethoxazole-trimethoprim (BACTRIM DS) 800-160 MG per tablet Take 1 tablet by mouth 2 (two) times daily. (Patient not taking: Reported on 11/18/2015) 20 tablet 0   No facility-administered medications prior to visit.  ROS:  Review of Systems BREAST: No symptoms   OBJECTIVE:   Vitals:  There were no vitals taken for this visit.  Physical Exam  Results: No results found for this or any previous visit (from the past 24 hour(s)).   Assessment/Plan: No diagnosis found.    No orders of the defined types were placed in this encounter.     No follow-ups on file.  Nolene Rocks B. Adilenne Ashworth, PA-C 07/19/2020 2:26 PM

## 2020-07-20 ENCOUNTER — Encounter: Payer: 59 | Admitting: Obstetrics and Gynecology

## 2020-09-01 IMAGING — DX DG CHEST 1V PORT
1 series · 1 of 1 positions shown · non-contrast
Comparison: Radiograph April 26, 2006

CLINICAL DATA: Shortness of breath, lightheadedness and headache
for 3 days

EXAM:
PORTABLE CHEST 1 VIEW

[chest ap]
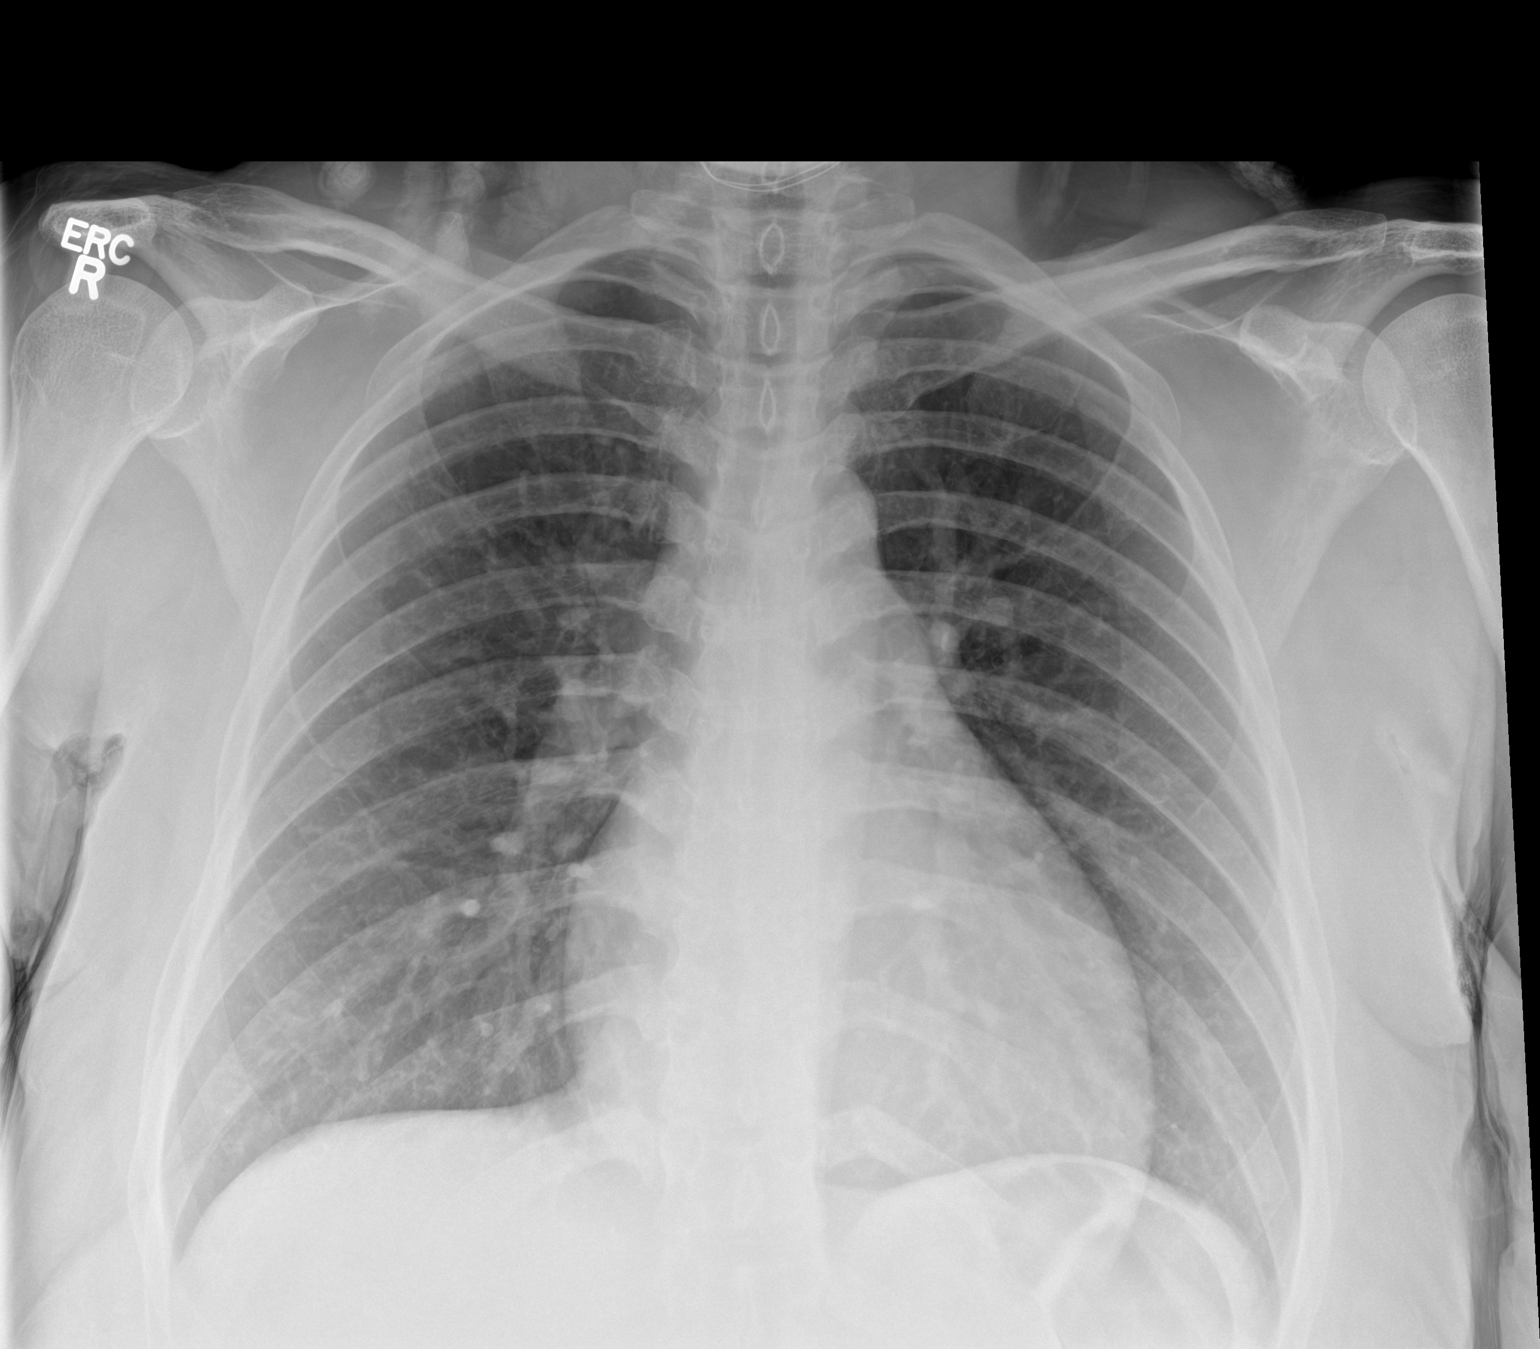

[1 of 1 positions shown; findings below may reference images not displayed]

FINDINGS: No consolidation, features of edema, pneumothorax, or effusion.
Pulmonary vascularity is normally distributed. The cardiomediastinal
contours are unremarkable. No acute osseous or soft tissue
abnormality.
IMPRESSION: No acute cardiopulmonary abnormality.

## 2020-09-02 IMAGING — CT CT HEAD W/O CM
3 series · 16 of 45 positions shown, 19 images · non-contrast
Comparison: None.

CLINICAL DATA: 28-year-old female with shortness of breath and
lightheadedness.

EXAM:
CT HEAD WITHOUT CONTRAST
TECHNIQUE: Contiguous axial images were obtained from the base of the skull
through the vertex without intravenous contrast.

[Series 2: head wo · axial · 0.40mm/px · z∈[-68,+47]mm · 10 of 28 slices shown, 13 images]
[im 3/28  brain]
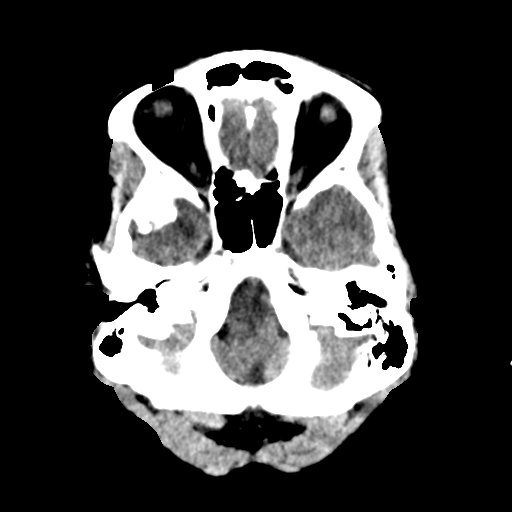
[im 3/28  bone]
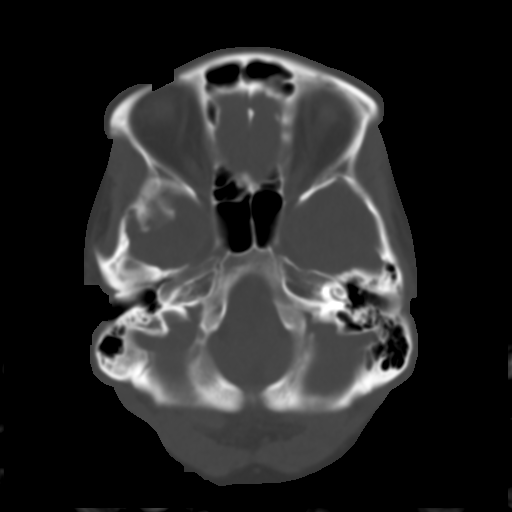
[im 5/28  brain]
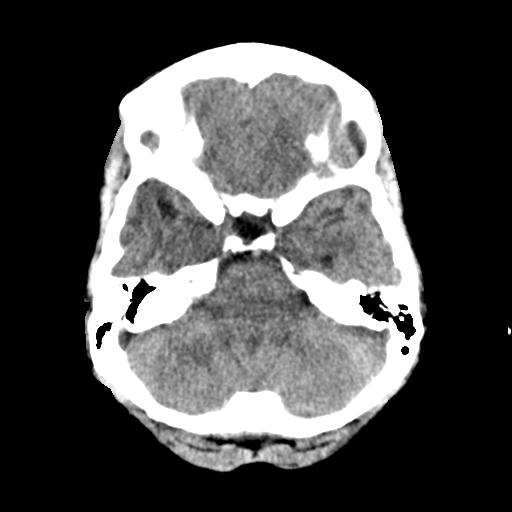
[im 8/28  brain]
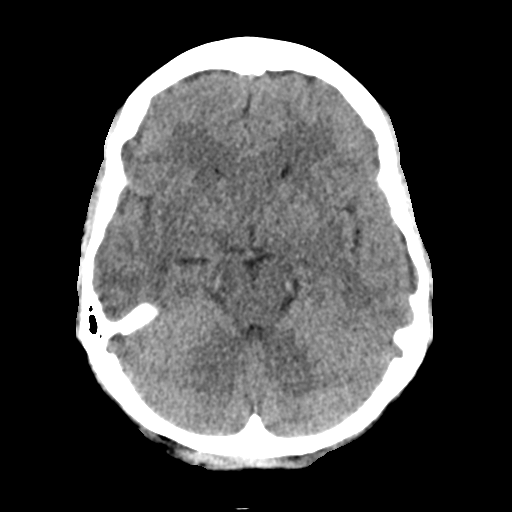
[im 11/28  brain]
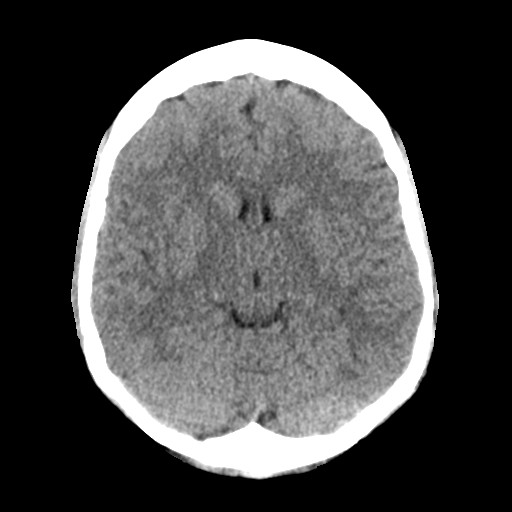
[im 13/28  brain]
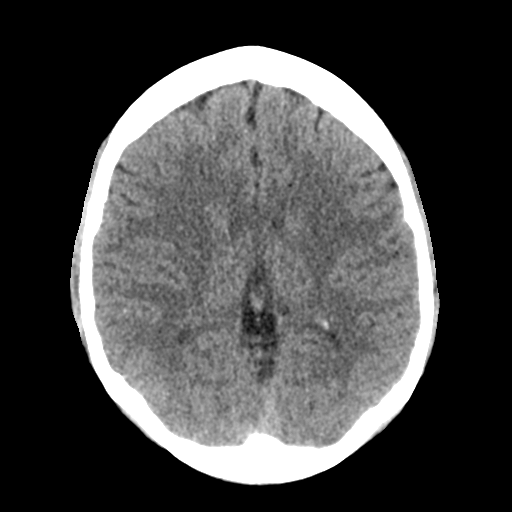
[im 13/28  bone]
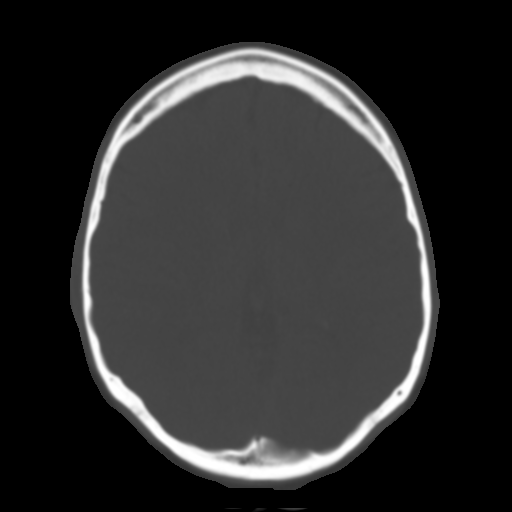
[im 16/28  brain]
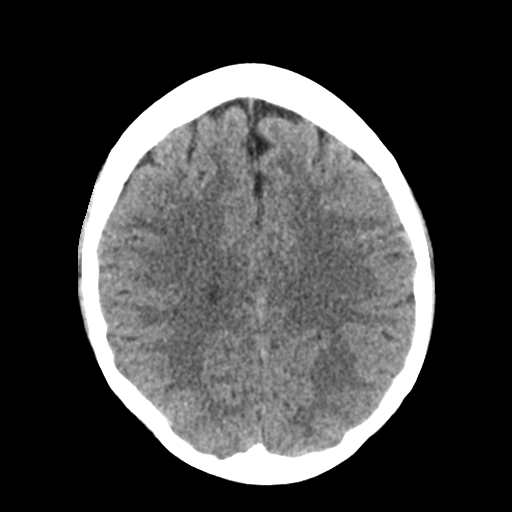
[im 18/28  brain]
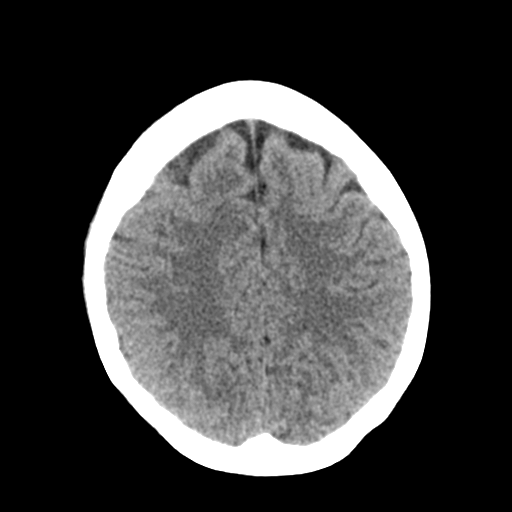
[im 21/28  brain]
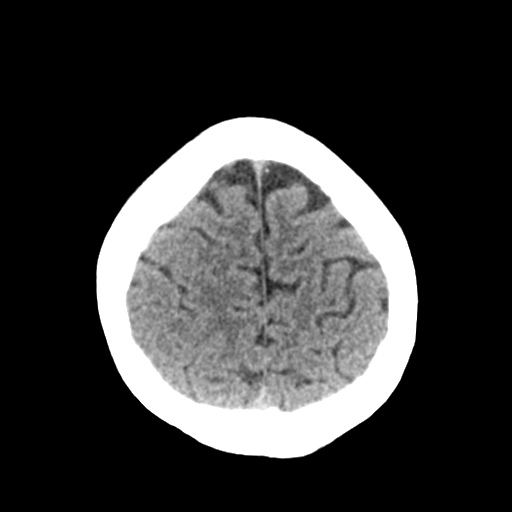
[im 24/28  brain]
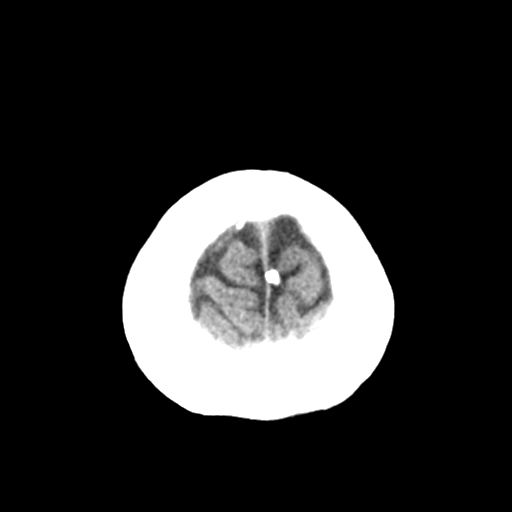
[im 24/28  bone]
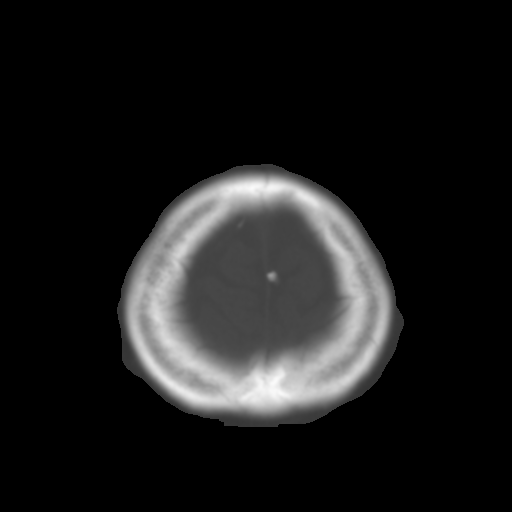
[im 26/28  brain]
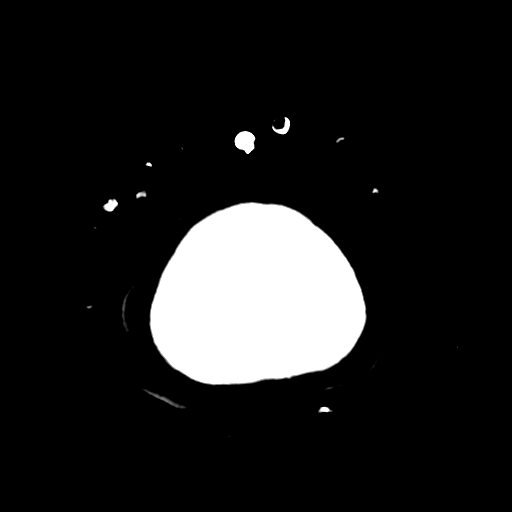

[Series 4: coronal soft tissue · coronal · 0.30mm/px · 3 of 59 slices shown]
[im 20/59  brain]
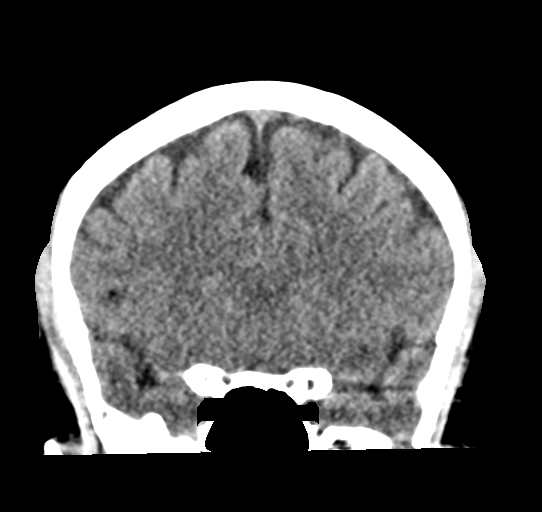
[im 26/59  brain]
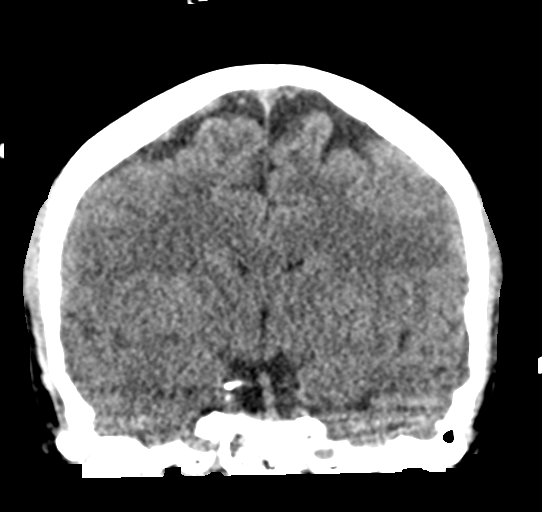
[im 33/59  brain]
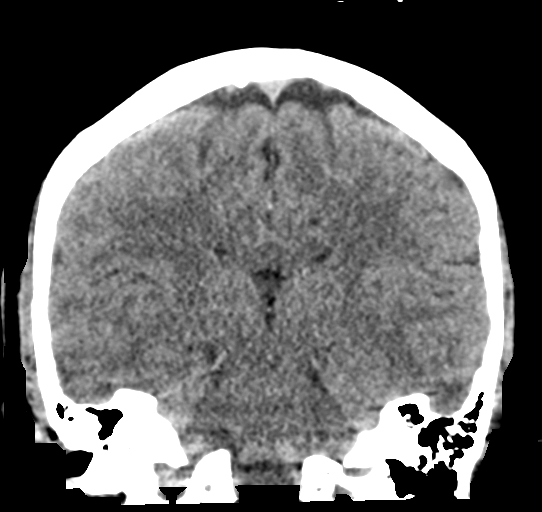

[Series 5: sagittal soft tissue · sagittal · 0.29mm/px · 3 of 49 slices shown]
[im 17/49  brain]
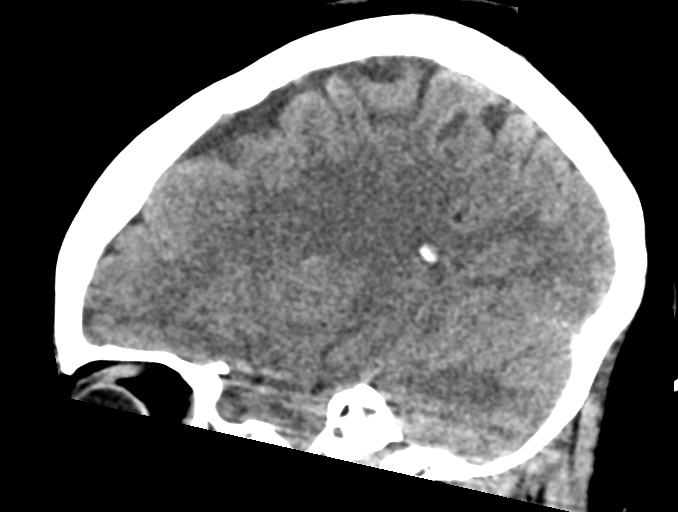
[im 25/49  brain]
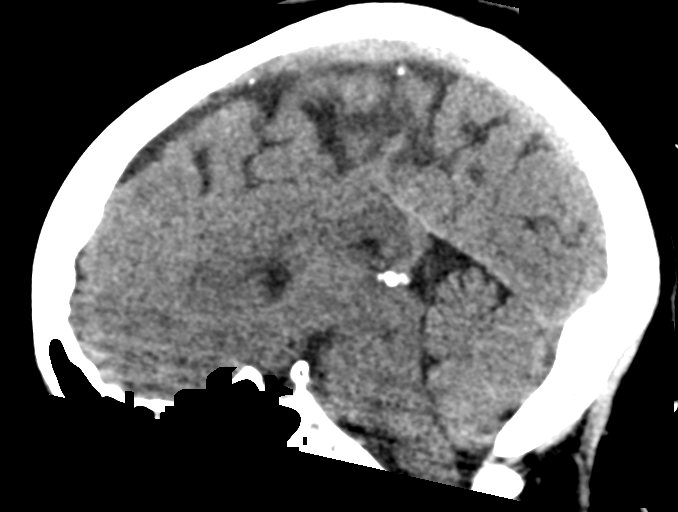
[im 33/49  brain]
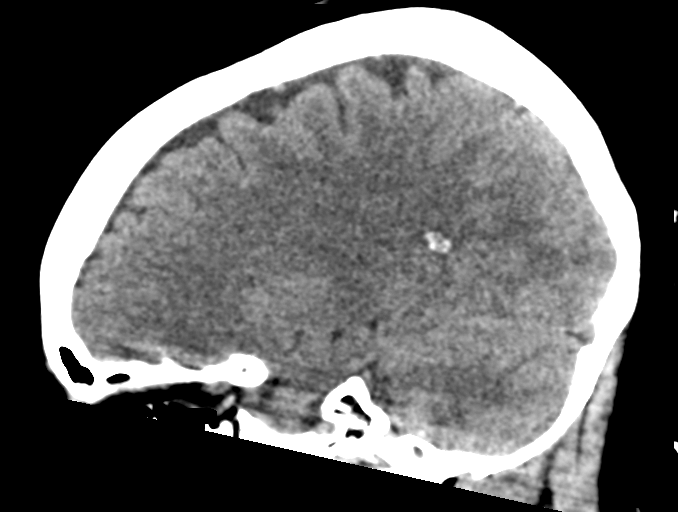

[16 of 45 positions shown; findings below may reference images not displayed]

FINDINGS: Brain: No evidence of acute infarction, hemorrhage, hydrocephalus,
extra-axial collection or mass lesion/mass effect.

Vascular: No hyperdense vessel or unexpected calcification.

Skull: Normal. Negative for fracture or focal lesion.

Sinuses/Orbits: There is diffuse mucoperiosteal thickening of
paranasal sinuses. No air-fluid level. The mastoid air cells are
clear.

Other: None
IMPRESSION: 1. Normal unenhanced CT of the brain.
2. Paranasal sinus disease.

## 2021-02-17 ENCOUNTER — Encounter: Payer: Self-pay | Admitting: Advanced Practice Midwife

## 2021-02-17 ENCOUNTER — Other Ambulatory Visit: Payer: Self-pay

## 2021-02-17 ENCOUNTER — Ambulatory Visit (INDEPENDENT_AMBULATORY_CARE_PROVIDER_SITE_OTHER): Payer: Medicaid Other | Admitting: Advanced Practice Midwife

## 2021-02-17 ENCOUNTER — Other Ambulatory Visit (HOSPITAL_COMMUNITY)
Admission: RE | Admit: 2021-02-17 | Discharge: 2021-02-17 | Disposition: A | Discharge status: Home / Self Care | Payer: Self-pay | Source: Ambulatory Visit | Attending: Advanced Practice Midwife | Admitting: Advanced Practice Midwife

## 2021-02-17 VITALS — BP 126/80 | Ht 68.0 in | Wt 218.0 lb

## 2021-02-17 DIAGNOSIS — N898 Other specified noninflammatory disorders of vagina: Secondary | ICD-10-CM | POA: Diagnosis not present

## 2021-02-17 DIAGNOSIS — Z124 Encounter for screening for malignant neoplasm of cervix: Secondary | ICD-10-CM | POA: Insufficient documentation

## 2021-02-17 DIAGNOSIS — Z1159 Encounter for screening for other viral diseases: Secondary | ICD-10-CM | POA: Diagnosis not present

## 2021-02-17 DIAGNOSIS — Z30011 Encounter for initial prescription of contraceptive pills: Secondary | ICD-10-CM

## 2021-02-17 DIAGNOSIS — Z01419 Encounter for gynecological examination (general) (routine) without abnormal findings: Secondary | ICD-10-CM | POA: Insufficient documentation

## 2021-02-17 DIAGNOSIS — Z113 Encounter for screening for infections with a predominantly sexual mode of transmission: Secondary | ICD-10-CM | POA: Diagnosis not present

## 2021-02-17 MED ORDER — NORGESTIMATE-ETH ESTRADIOL 0.25-35 MG-MCG PO TABS: 1.0000 | ORAL_TABLET | Freq: Every day | ORAL | 3 refills | Status: DC

## 2021-02-17 NOTE — Progress Notes (Signed)
Gynecology Annual Exam   PCP: Patient, No Pcp Per  Chief Complaint:  Chief Complaint  Patient presents with  . Annual Exam    History of Present Illness: Patient is a 30 y.o. G2P1011 presents for annual exam. The patient has complaint today of "pH is off". Last Friday and Saturday she had abnormal bloody discharge with odor. She denies itching or irritation and no current symptoms. She requests vaginitis and STD testing. She does not currently use condoms or hormonal birth control. She does pay attention to her cycle and generally avoids fertile time of the month. She is interested in starting OCP.  LMP: Patient's last menstrual period was 01/23/2021. Average Interval: regular, 28 days Duration of flow: 5 days Heavy Menses: yes changes pad 3-4 times per day Clots: no Intermenstrual Bleeding: no Postcoital Bleeding: no Dysmenorrhea: no  The patient is sexually active. She currently uses rhythm method for contraception. She denies dyspareunia.  The patient does not perform self breast exams.  There is no notable family history of breast or ovarian cancer in her family.  The patient wears seatbelts: yes.   The patient has regular exercise: she walks regularly, she eats a lot of fried foods and sweets, she admits adequate hydration and usually gets 5-6 hours sleep.    The patient denies current symptoms of depression.    Review of Systems: Review of Systems  Constitutional: Negative for chills and fever.  HENT: Negative for congestion, ear discharge, ear pain, hearing loss, sinus pain and sore throat.   Eyes: Negative for blurred vision and double vision.  Respiratory: Negative for cough, shortness of breath and wheezing.   Cardiovascular: Negative for chest pain, palpitations and leg swelling.  Gastrointestinal: Negative for abdominal pain, blood in stool, constipation, diarrhea, heartburn, melena, nausea and vomiting.  Genitourinary: Negative for dysuria, flank pain, frequency,  hematuria and urgency.       Positive for discharge and odor  Musculoskeletal: Negative for back pain, joint pain and myalgias.  Skin: Negative for itching and rash.  Neurological: Negative for dizziness, tingling, tremors, sensory change, speech change, focal weakness, seizures, loss of consciousness, weakness and headaches.  Endo/Heme/Allergies: Negative for environmental allergies. Does not bruise/bleed easily.  Psychiatric/Behavioral: Negative for depression, hallucinations, memory loss, substance abuse and suicidal ideas. The patient is not nervous/anxious and does not have insomnia.     Past Medical History:  Patient Active Problem List   Diagnosis Date Noted  . Obesity (BMI 30-39.9) 03/15/2017  . Thyromegaly 03/15/2017    Past Surgical History:  Past Surgical History:  Procedure Laterality Date  . TONSILLECTOMY      Gynecologic History:  Patient's last menstrual period was 01/23/2021. Contraception: rhythm method Last Pap: 2018 at [facility] Results were: no abnormalities   Obstetric History: G2P1011  Family History:  History reviewed. No pertinent family history.  Social History:  Social History   Socioeconomic History  . Marital status: Single    Spouse name: Not on file  . Number of children: Not on file  . Years of education: Not on file  . Highest education level: Not on file  Occupational History  . Not on file  Tobacco Use  . Smoking status: Former Games developer  . Smokeless tobacco: Never Used  Substance and Sexual Activity  . Alcohol use: Yes  . Drug use: No  . Sexual activity: Yes    Birth control/protection: None  Other Topics Concern  . Not on file  Social History Narrative  .  Not on file   Social Determinants of Health   Financial Resource Strain: Not on file  Food Insecurity: Not on file  Transportation Needs: Not on file  Physical Activity: Not on file  Stress: Not on file  Social Connections: Not on file  Intimate Partner Violence: Not on file     Allergies:  Allergies  Allergen Reactions  . Shellfish Allergy Anaphylaxis    Medications: Prior to Admission medications   Medication Sig Start Date End Date Taking? Authorizing Provider  norgestimate-ethinyl estradiol (ORTHO-CYCLEN) 0.25-35 MG-MCG tablet Take 1 tablet by mouth daily. 02/17/21  Yes Tresea Mall, CNM  valACYclovir (VALTREX) 1000 MG tablet Take by mouth. 08/20/18  Yes [provider]    Physical Exam Vitals: Blood pressure 126/80, height 5\' 8"  (1.727 m), weight 218 lb (98.9 kg), last menstrual period 01/23/2021.  General: NAD HEENT: normocephalic, anicteric Thyroid: no enlargement, no palpable nodules Pulmonary: No increased work of breathing, CTAB Cardiovascular: RRR, distal pulses 2+ Breast: Breast symmetrical, no tenderness, no palpable nodules or masses, no skin or nipple retraction present, no nipple discharge.  No axillary or supraclavicular lymphadenopathy. Abdomen: NABS, soft, non-tender, non-distended.  Umbilicus without lesions.  No hepatomegaly, splenomegaly or masses palpable. No evidence of hernia  Genitourinary:  External: Normal external female genitalia.  Normal urethral meatus, normal Bartholin's and Skene's glands.    Vagina: Normal vaginal mucosa, no evidence of prolapse.    Cervix: Grossly normal in appearance, no bleeding  Uterus: Non-enlarged, mobile, normal contour.  No CMT  Adnexa: ovaries non-enlarged, no adnexal masses  Rectal: deferred  Lymphatic: no evidence of inguinal lymphadenopathy Extremities: no edema, erythema, or tenderness Neurologic: Grossly intact Psychiatric: mood appropriate, affect full   Assessment: 30 y.o. G2P1011 routine annual exam  Plan: Problem List Items Addressed This Visit   None   Visit Diagnoses    Well woman exam with routine gynecological exam    -  Primary   Relevant Medications   norgestimate-ethinyl estradiol (ORTHO-CYCLEN) 0.25-35 MG-MCG tablet   Other Relevant Orders   Cytology -  PAP   Cervicovaginal ancillary only   RPR Qual   HIV Antibody (routine testing w rflx)   Hepatitis panel, acute   Screen for sexually transmitted diseases       Relevant Orders   Cytology - PAP   RPR Qual   HIV Antibody (routine testing w rflx)   Hepatitis panel, acute   Screening for cervical cancer       Relevant Orders   Cytology - PAP   Need for hepatitis C screening test       Relevant Orders   Hepatitis panel, acute   Need for hepatitis B screening test       Relevant Orders   Hepatitis panel, acute   Vaginal discharge       Relevant Orders   Cervicovaginal ancillary only   Vaginal odor       Relevant Orders   Cervicovaginal ancillary only   Encounter for initial prescription of contraceptive pills       Relevant Medications   norgestimate-ethinyl estradiol (ORTHO-CYCLEN) 0.25-35 MG-MCG tablet      1) STI screening  was offered and accepted  2)  ASCCP guidelines and rationale discussed.  Patient opts for every 3 years screening interval. PAPtima today  3) Contraception - the patient is currently using  rhythm method.  She is interested in changing to [facility]. Start Sprintec today.  4) Routine healthcare maintenance including cholesterol, diabetes screening discussed Declines  5) Increase healthy lifestyle; diet, sleep  6) Return in about 1 year (around 02/17/2022) for annual established gyn.   Tresea Mall, CNM [facility] 02/17/2021, 2:21 PM

## 2021-02-17 NOTE — Patient Instructions (Signed)
Health Maintenance, Female Adopting a healthy lifestyle and getting preventive care are important in promoting health and wellness. Ask your health care provider about:  The right schedule for you to have regular tests and exams.  Things you can do on your own to prevent diseases and keep yourself healthy. What should I know about diet, weight, and exercise? Eat a healthy diet  Eat a diet that includes plenty of vegetables, fruits, low-fat dairy products, and lean protein.  Do not eat a lot of foods that are high in solid fats, added sugars, or sodium.   Maintain a healthy weight Body mass index (BMI) is used to identify weight problems. It estimates body fat based on height and weight. Your health care provider can help determine your BMI and help you achieve or maintain a healthy weight. Get regular exercise Get regular exercise. This is one of the most important things you can do for your health. Most adults should:  Exercise for at least 150 minutes each week. The exercise should increase your heart rate and make you sweat (moderate-intensity exercise).  Do strengthening exercises at least twice a week. This is in addition to the moderate-intensity exercise.  Spend less time sitting. Even light physical activity can be beneficial. Watch cholesterol and blood lipids Have your blood tested for lipids and cholesterol at 30 years of age, then have this test every 5 years. Have your cholesterol levels checked more often if:  Your lipid or cholesterol levels are high.  You are older than 30 years of age.  You are at high risk for heart disease. What should I know about cancer screening? Depending on your health history and family history, you may need to have cancer screening at various ages. This may include screening for:  Breast cancer.  Cervical cancer.  Colorectal cancer.  Skin cancer.  Lung cancer. What should I know about heart disease, diabetes, and high blood  pressure? Blood pressure and heart disease  High blood pressure causes heart disease and increases the risk of stroke. This is more likely to develop in people who have high blood pressure readings, are of African descent, or are overweight.  Have your blood pressure checked: ? Every 3-5 years if you are 18-39 years of age. ? Every year if you are 40 years old or older. Diabetes Have regular diabetes screenings. This checks your fasting blood sugar level. Have the screening done:  Once every three years after age 40 if you are at a normal weight and have a low risk for diabetes.  More often and at a younger age if you are overweight or have a high risk for diabetes. What should I know about preventing infection? Hepatitis B If you have a higher risk for hepatitis B, you should be screened for this virus. Talk with your health care provider to find out if you are at risk for hepatitis B infection. Hepatitis C Testing is recommended for:  Everyone born from 1945 through 1965.  Anyone with known risk factors for hepatitis C. Sexually transmitted infections (STIs)  Get screened for STIs, including gonorrhea and chlamydia, if: ? You are sexually active and are younger than 30 years of age. ? You are older than 30 years of age and your health care provider tells you that you are at risk for this type of infection. ? Your sexual activity has changed since you were last screened, and you are at increased risk for chlamydia or gonorrhea. Ask your health care provider   if you are at risk.  Ask your health care provider about whether you are at high risk for HIV. Your health care provider may recommend a prescription medicine to help prevent HIV infection. If you choose to take medicine to prevent HIV, you should first get tested for HIV. You should then be tested every 3 months for as long as you are taking the medicine. Pregnancy  If you are about to stop having your period (premenopausal) and  you may become pregnant, seek counseling before you get pregnant.  Take 400 to 800 micrograms (mcg) of folic acid every day if you become pregnant.  Ask for birth control (contraception) if you want to prevent pregnancy. Osteoporosis and menopause Osteoporosis is a disease in which the bones lose minerals and strength with aging. This can result in bone fractures. If you are 65 years old or older, or if you are at risk for osteoporosis and fractures, ask your health care provider if you should:  Be screened for bone loss.  Take a calcium or vitamin D supplement to lower your risk of fractures.  Be given hormone replacement therapy (HRT) to treat symptoms of menopause. Follow these instructions at home: Lifestyle  Do not use any products that contain nicotine or tobacco, such as cigarettes, e-cigarettes, and chewing tobacco. If you need help quitting, ask your health care provider.  Do not use street drugs.  Do not share needles.  Ask your health care provider for help if you need support or information about quitting drugs. Alcohol use  Do not drink alcohol if: ? Your health care provider tells you not to drink. ? You are pregnant, may be pregnant, or are planning to become pregnant.  If you drink alcohol: ? Limit how much you use to 0-1 drink a day. ? Limit intake if you are breastfeeding.  Be aware of how much alcohol is in your drink. In the [place] one drink equals one 12 oz bottle of beer (355 mL), one 5 oz glass of wine (148 mL), or one 1 oz glass of hard liquor (44 mL). General instructions  Schedule regular health, dental, and eye exams.  Stay current with your vaccines.  Tell your health care provider if: ? You often feel depressed. ? You have ever been abused or do not feel safe at home. Summary  Adopting a healthy lifestyle and getting preventive care are important in promoting health and wellness.  Follow your health care provider's instructions about healthy  diet, exercising, and getting tested or screened for diseases.  Follow your health care provider's instructions on monitoring your cholesterol and blood pressure. This information is not intended to replace advice given to you by your health care provider. Make sure you discuss any questions you have with your health care provider. Document Revised: 11/13/2018 Document Reviewed: 11/13/2018 Elsevier Patient Education  2021 Elsevier Inc.  

## 2021-02-18 ENCOUNTER — Other Ambulatory Visit: Payer: Self-pay | Admitting: Advanced Practice Midwife

## 2021-02-18 DIAGNOSIS — N760 Acute vaginitis: Secondary | ICD-10-CM

## 2021-02-18 LAB — HIV ANTIBODY (ROUTINE TESTING W REFLEX): HIV Screen 4th Generation wRfx: NONREACTIVE

## 2021-02-18 LAB — CERVICOVAGINAL ANCILLARY ONLY
Bacterial Vaginitis (gardnerella): POSITIVE — AB
Candida Glabrata: NEGATIVE
Candida Vaginitis: NEGATIVE
Comment: NEGATIVE
Comment: NEGATIVE
Comment: NEGATIVE

## 2021-02-18 LAB — HEPATITIS PANEL, ACUTE
Hep A IgM: NEGATIVE
Hep B C IgM: NEGATIVE
Hep C Virus Ab: <0.1 s/co ratio (ref 0.0–0.9)
Hepatitis B Surface Ag: NEGATIVE

## 2021-02-18 LAB — RPR QUALITATIVE: RPR Ser Ql: NONREACTIVE

## 2021-02-18 MED ORDER — METRONIDAZOLE 500 MG PO TABS: 500.0000 mg | ORAL_TABLET | Freq: Two times a day (BID) | ORAL | 0 refills | Status: AC

## 2021-02-18 NOTE — Progress Notes (Signed)
Rx metronidazole sent to [facility] to treat BV. Voicemail left for patient.

## 2021-02-21 LAB — CYTOLOGY - PAP
Chlamydia: NEGATIVE
Comment: NEGATIVE
Comment: NEGATIVE
Comment: NORMAL
Diagnosis: NEGATIVE
Neisseria Gonorrhea: NEGATIVE
Trichomonas: NEGATIVE

## 2021-03-16 ENCOUNTER — Telehealth: Payer: Self-pay

## 2021-03-16 NOTE — Telephone Encounter (Signed)
Pt calling; went to p/u rx and had another rx which she didn't get b/c she didn't know anything about it since her labs were fine; she didn't take it.  Please call.  [phone]

## 2021-03-17 NOTE — Telephone Encounter (Signed)
That is not accurate- one note says she has BV which is true and the other note says she has a normal PAP and STD test results which is also true. She should take the medication. Please let her know.

## 2021-03-21 NOTE — Telephone Encounter (Signed)
Pt aware.

## 2022-12-01 ENCOUNTER — Emergency Department: Payer: Self-pay

## 2022-12-01 ENCOUNTER — Emergency Department
Admission: EM | Admit: 2022-12-01 | Discharge: 2022-12-01 | Disposition: A | Discharge status: Home / Self Care | Payer: Self-pay | Attending: Emergency Medicine | Admitting: Emergency Medicine

## 2022-12-01 DIAGNOSIS — R519 Headache, unspecified: Secondary | ICD-10-CM | POA: Insufficient documentation

## 2022-12-01 DIAGNOSIS — N920 Excessive and frequent menstruation with regular cycle: Secondary | ICD-10-CM | POA: Insufficient documentation

## 2022-12-01 LAB — URINALYSIS, ROUTINE W REFLEX MICROSCOPIC
Bilirubin Urine: NEGATIVE
Glucose, UA: NEGATIVE mg/dL
Hgb urine dipstick: NEGATIVE
Ketones, ur: NEGATIVE mg/dL
Leukocytes,Ua: NEGATIVE
Nitrite: NEGATIVE
Protein, ur: NEGATIVE mg/dL
Specific Gravity, Urine: 1.014 (ref 1.005–1.030)
pH: 8 (ref 5.0–8.0)

## 2022-12-01 LAB — COMPREHENSIVE METABOLIC PANEL
ALT: 17 U/L (ref 0–44)
AST: 20 U/L (ref 15–41)
Albumin: 3.8 g/dL (ref 3.5–5.0)
Alkaline Phosphatase: 39 U/L (ref 38–126)
Anion gap: 6 (ref 5–15)
BUN: 11 mg/dL (ref 6–20)
CO2: 25 mmol/L (ref 22–32)
Calcium: 9.1 mg/dL (ref 8.9–10.3)
Chloride: 106 mmol/L (ref 98–111)
Creatinine, Ser: 0.69 mg/dL (ref 0.44–1.00)
GFR, Estimated: >60 mL/min (ref >60)
Glucose, Bld: 91 mg/dL (ref 70–99)
Potassium: 4.3 mmol/L (ref 3.5–5.1)
Sodium: 137 mmol/L (ref 135–145)
Total Bilirubin: 0.2 mg/dL — ABNORMAL LOW (ref 0.3–1.2)
Total Protein: 7.4 g/dL (ref 6.5–8.1)

## 2022-12-01 LAB — POC URINE PREG, ED: Preg Test, Ur: NEGATIVE

## 2022-12-01 LAB — CBC
HCT: 38.1 % (ref 36.0–46.0)
Hemoglobin: 12.1 g/dL (ref 12.0–15.0)
MCH: 28.9 pg (ref 26.0–34.0)
MCHC: 31.8 g/dL (ref 30.0–36.0)
MCV: 91.1 fL (ref 80.0–100.0)
Platelets: 323 10*3/uL (ref 150–400)
RBC: 4.18 MIL/uL (ref 3.87–5.11)
RDW: 12.8 % (ref 11.5–15.5)
WBC: 4.2 10*3/uL (ref 4.0–10.5)
nRBC: 0 % (ref 0.0–0.2)

## 2022-12-01 LAB — LIPASE, BLOOD: Lipase: 32 U/L (ref 11–51)

## 2022-12-01 MED ORDER — ONDANSETRON 4 MG PO TBDP: 4.0000 mg | ORAL_TABLET | Freq: Three times a day (TID) | ORAL | 0 refills | Status: DC | PRN

## 2022-12-01 MED ORDER — CYCLOBENZAPRINE HCL 5 MG PO TABS: 5.0000 mg | ORAL_TABLET | Freq: Three times a day (TID) | ORAL | 0 refills | Status: DC | PRN

## 2022-12-01 NOTE — ED Provider Notes (Incomplete)
[facility] Emergency Department Provider Note     Event Date/Time   First MD Initiated Contact with Patient 12/01/22 1618     (approximate)   History   Abdominal Pain  HPI  Mariah Watson is a 31 y.o. female presents to the ED for evaluation of headaches and abnormal menses. She denies a significant medical history including endometriosis, ovarian cysts, fibroids. No current medications. She also denies FCS, NVD, vision change, head injury, or weakness, vertigo, or paralysis. She denies significant benefit with OTC meds.   Physical Exam   Triage Vital Signs: ED Triage Vitals  Enc Vitals Group     BP 12/01/22 1441 117/74     Pulse Rate 12/01/22 1441 85     Resp 12/01/22 1441 19     Temp 12/01/22 1441 98.3 F (36.8 C)     Temp Source 12/01/22 1441 Oral     SpO2 12/01/22 1441 99 %     Weight 12/01/22 1442 230 lb (104.3 kg)     Height --      Head Circumference --      Peak Flow --      Pain Score --      Pain Loc --      Pain Edu? --      Excl. in GC? --     Most recent vital signs: Vitals:   12/01/22 1441  BP: 117/74  Pulse: 85  Resp: 19  Temp: 98.3 F (36.8 C)  SpO2: 99%    General Awake, no distress. NAD HENT:   NCAT. PERRLA. Normal fundi bilaterally CV:  Good peripheral perfusion.  RESP:  Normal effort.  ABD:  No distention.  NEURO: CN II-XII grossly intact.    ED Results / Procedures / Treatments   Labs (all labs ordered are listed, but only abnormal results are displayed) Labs Reviewed  COMPREHENSIVE METABOLIC PANEL - Abnormal; Notable for the following components:      Result Value   Total Bilirubin 0.2 (*)    All other components within normal limits  URINALYSIS, ROUTINE W REFLEX MICROSCOPIC - Abnormal; Notable for the following components:   Color, Urine YELLOW (*)    APPearance CLEAR (*)    All other components within normal limits  LIPASE, BLOOD  CBC  POC URINE PREG, ED     EKG   RADIOLOGY  I  personally viewed and evaluated these images as part of my medical decision making, as well as reviewing the written report by the radiologist.  ED Provider Interpretation: no acute findings  CT HEAD WO CONTRAST ( )  Result Date: 12/01/2022 CLINICAL DATA:  Headache, increasing frequency or severity new headache EXAM: CT HEAD WITHOUT CONTRAST TECHNIQUE: Contiguous axial images were obtained from the base of the skull through the vertex without intravenous contrast. RADIATION DOSE REDUCTION: This exam was performed according to the departmental dose-optimization program which includes automated exposure control, adjustment of the mA and/or kV according to patient size and/or use of iterative reconstruction technique. COMPARISON:  CT head 10/04/2019 FINDINGS: Brain: No evidence of large-territorial acute infarction. No parenchymal hemorrhage. No mass lesion. No extra-axial collection. No mass effect or midline shift. No hydrocephalus. Basilar cisterns are patent. Vascular: No hyperdense vessel. Skull: No acute fracture or focal lesion. Sinuses/Orbits: Paranasal sinuses and mastoid air cells are clear. The orbits are unremarkable. Other: None. IMPRESSION: No acute intracranial abnormality. Electronically Signed   By: Tish Frederickson M.D.   On: 12/01/2022 17:37  PROCEDURES:  Critical Care performed: No  Procedures   MEDICATIONS ORDERED IN ED: Medications - No data to display   IMPRESSION / MDM / ASSESSMENT AND PLAN / ED COURSE  I reviewed the triage vital signs and the nursing notes.                              Differential diagnosis includes, but is not limited to, intracranial hemorrhage, meningitis/encephalitis, previous head trauma, cavernous venous thrombosis, tension headache, temporal arteritis, migraine or migraine equivalent, idiopathic intracranial hypertension, and non-specific headache.   Patient's presentation is most consistent with acute complicated illness / injury  requiring diagnostic workup.  Patient's diagnosis is consistent with ***. Patient will be discharged home with prescriptions for ***. Patient is to follow up with *** as needed or otherwise directed. Patient is given ED precautions to return to the ED for any worsening or new symptoms.     FINAL CLINICAL IMPRESSION(S) / ED DIAGNOSES   Final diagnoses:  Acute nonintractable headache, unspecified headache type  Menorrhagia with regular cycle     Rx / DC Orders   ED Discharge Orders          Ordered    cyclobenzaprine (FLEXERIL) 5 MG tablet  3 times daily PRN        12/01/22 1752    ondansetron (ZOFRAN-ODT) 4 MG disintegrating tablet  Every 8 hours PRN        12/01/22 1752             Note:  This document was prepared using Dragon voice recognition software and may include unintentional dictation errors.

## 2022-12-01 NOTE — ED Triage Notes (Signed)
Pt sts that she has been having migraines for the last three months. Also, pt has been having irregular periods with abd pain.

## 2022-12-01 NOTE — Discharge Instructions (Addendum)
Your exam, labs, and CT scan are normal and reassuring at this time. Take the prescription meds along with OTC Tylenol, Motrin, and Benadryl for headache pain management. Follow-up with a local primary care provider for ongoing management. Follow-up with your GYN provider for evaluation of her heavy menstrual periods. Please go to the following website to schedule new (and existing) patient appointments:   [email or web address]   The following is a list of primary care offices in the area who are accepting new patients at this time.  Please reach out to one of them directly and let them know you would like to schedule an appointment to follow up on an Emergency Department visit, and/or to establish a new primary care provider (PCP).  There are likely other primary care clinics in the are who are accepting new patients, but this is an excellent place to start:  [facility] Lead physician: Dr Shirlee Latch [address] [place], [place] [address] [phone]  [facility] Lead Physician: Dr Alba Cory [address], [place], [place] [address] [phone]  [facility]  Lead Physician: Dr Olevia Perches [address] [place], [place] [address] [phone]  [facility] Lead Physician: Dr Sofie Hartigan [address] [place], [place] [address] [phone]  [facility] Lead Physician: Dr Bari Edward [address] [place], [place], [place] [address] [phone]

## 2022-12-01 NOTE — ED Provider Triage Note (Signed)
Emergency Medicine Provider Triage Evaluation Note  Mariah Watson , a 31 y.o. female  was evaluated in triage.  Pt complains of headache x3 months. Has had headaches before. Reports subrapubic abdominal pain this week every other day. Reports vaginal bleeding intermittently this month. LMP 8 days ago. Not sexually active. No fever. No vomiting  Review of Systems  Positive: Headaches, irregular bleeding, suprapubic discomfort Negative: Fever, v/d  Physical Exam  BP 117/74 (BP Location: Left Arm)   Pulse 85   Temp 98.3 F (36.8 C) (Oral)   Resp 19   Wt 104.3 kg   SpO2 99%   BMI 34.97 kg/m  Gen:   Awake, no distress   Resp:  Normal effort  MSK:   Moves extremities without difficulty  Other:    Medical Decision Making  Medically screening exam initiated at 2:44 PM.  Appropriate orders placed.  Mariah Watson was informed that the remainder of the evaluation will be completed by another provider, this initial triage assessment does not replace that evaluation, and the importance of remaining in the ED until their evaluation is complete.     Jackelyn Hoehn, PA-C 12/01/22 1446

## 2023-05-09 ENCOUNTER — Encounter: Payer: Self-pay | Admitting: Family Medicine

## 2023-05-09 ENCOUNTER — Ambulatory Visit (LOCAL_COMMUNITY_HEALTH_CENTER): Payer: Medicaid Other | Admitting: Family Medicine

## 2023-05-09 VITALS — BP 114/62 | Wt 232.6 lb

## 2023-05-09 DIAGNOSIS — Z309 Encounter for contraceptive management, unspecified: Secondary | ICD-10-CM | POA: Diagnosis not present

## 2023-05-09 DIAGNOSIS — Z113 Encounter for screening for infections with a predominantly sexual mode of transmission: Secondary | ICD-10-CM

## 2023-05-09 DIAGNOSIS — Z01419 Encounter for gynecological examination (general) (routine) without abnormal findings: Secondary | ICD-10-CM | POA: Diagnosis not present

## 2023-05-09 DIAGNOSIS — Z3009 Encounter for other general counseling and advice on contraception: Secondary | ICD-10-CM

## 2023-05-09 DIAGNOSIS — B009 Herpesviral infection, unspecified: Secondary | ICD-10-CM

## 2023-05-09 DIAGNOSIS — E669 Obesity, unspecified: Secondary | ICD-10-CM

## 2023-05-09 LAB — HM HIV SCREENING LAB: HM HIV Screening: NEGATIVE

## 2023-05-09 LAB — WET PREP FOR TRICH, YEAST, CLUE
Trichomonas Exam: NEGATIVE
Yeast Exam: NEGATIVE

## 2023-05-09 MED ORDER — NORELGESTROMIN-ETH ESTRADIOL 150-35 MCG/24HR TD PTWK: 1.0000 | MEDICATED_PATCH | TRANSDERMAL | 13 refills | Status: DC

## 2023-05-09 NOTE — Progress Notes (Signed)
Wet mount results reviewed, no treatment required per standing order.  Condoms and family planning packet given to pt.  Gaspar Garbe, RN

## 2023-05-09 NOTE — Progress Notes (Signed)
[facility] DEPARTMENT [facility] [address] Main Number: [phone]   Family Planning Visit- Initial Visit  Subjective:  TAHERA PRENDES is a 32 y.o.  G2P1011   being seen today for an initial annual visit and to discuss reproductive life planning.  The patient is currently using Abstinence for pregnancy prevention. Patient reports   does not want a pregnancy in the next year.     report they are looking for a method that provides Minimal bleeding/improved bleeding profile  Patient has the following medical conditions has Obesity (BMI 30-39.9) and HSV (herpes simplex virus) infection on their problem list.  Chief Complaint  Patient presents with   Annual Exam    PE/Pain on right side    Patient reports to clinic for PE and BCM. Reports she has had enlarged thyroid. Chart review- recent visit [facility] with workup- normal thyroid gland. Given prednisone for allergies. Pt states their symptoms remain and they feel their thyroid is enlarged. Pt became tearful at the end of visit. She is worried about her libido - reports 1 partner for the past 4 years- recent separation in October and now they are back together. She said the sex isn't the same and wonders if this is related to her being angry at him. Discussed changes in hormones but also underlying stress that can cause inability to achieve orgasm. Support provided.   Patient denies other concerns about self.    Body mass index is 35.37 kg/m. - Patient is eligible for diabetes screening based on BMI> 25 and age >35?  no HA1C ordered? not applicable  Patient reports 1  partner/s in last year. Desires STI screening?  Yes  Has patient been screened once for HCV in the past?  No  No results found for: "HCVAB"  Does the patient have current drug use (including MJ), have a partner with drug use, and/or has been incarcerated since last result? No  If yes-- Screen for HCV through [facility]   Does  the patient meet criteria for HBV testing? No  Criteria:  -Household, sexual or needle sharing contact with HBV -History of drug use -HIV positive -Those with known Hep C   Health Maintenance Due  Topic Date Due   COVID-19 Vaccine (1) Never done   DTaP/Tdap/Td (1 - Tdap) Never done    Review of Systems  Constitutional:  Negative for weight loss.  Eyes:  Negative for blurred vision.  Respiratory:  Negative for cough and shortness of breath.   Cardiovascular:  Negative for claudication.  Gastrointestinal:  Negative for nausea.  Genitourinary:  Negative for dysuria and frequency.  Skin:  Negative for rash.  Neurological:  Negative for headaches.  Endo/Heme/Allergies:  Does not bruise/bleed easily.    The following portions of the patient's history were reviewed and updated as appropriate: allergies, current medications, past family history, past medical history, past social history, past surgical history and problem list. Problem list updated.   See flowsheet for other program required questions.  Objective:   Vitals:   05/09/23 0949  BP: 114/62  Weight: 232 lb 9.6 oz (105.5 kg)    Physical Exam    Assessment and Plan:  ANILAH CIMINO is a 32 y.o. female presenting to the [facility] Department for an initial annual wellness/contraceptive visit  Contraception counseling: Reviewed options based on patient desire and reproductive life plan. Patient is interested in Contraceptive Patch. This was provided to the patient today.  Risks, benefits, and typical effectiveness rates were reviewed.  Questions were answered.  Written information was also given to the patient to review.    The patient will follow up in  1 years for surveillance.  The patient was told to call with any further questions, or with any concerns about this method of contraception.  Emphasized use of condoms 100% of the time for STI prevention.  Educated on ECP and assessed for need of ECP.  Not indicated- last sex in October  1. Well woman exam with routine gynecological exam -Up to date on CBE and pap smear- due for repeat in March 2025 -thyroid not enlarged on exam  2. Screening for venereal disease -discharge and pH of 5 today  - Chlamydia/Gonorrhea [place] [facility] - [facility] [place] - Syphilis Serology, [facility] - WET PREP FOR TRICH, YEAST, CLUE - Gonococcus culture  3. Obesity (BMI 30-39.9) -pt states they are working on their lifestyle and changes  4. HSV (herpes simplex virus) infection -no symptoms   Return in about 1 year (around 05/08/2024) for annual well-woman exam.  No future appointments.  Lenice Llamas, [place]

## 2023-05-13 LAB — GONOCOCCUS CULTURE

## 2023-05-18 NOTE — Addendum Note (Signed)
Addended by: Lenice Llamas on: 05/18/2023 02:15 PM   Modules accepted: Level of Service

## 2023-10-04 ENCOUNTER — Ambulatory Visit: Payer: Medicaid Other | Admitting: Family

## 2023-10-04 ENCOUNTER — Encounter: Payer: Self-pay | Admitting: Family

## 2023-10-04 VITALS — BP 132/80 | HR 90 | Ht 67.0 in | Wt 222.6 lb

## 2023-10-04 DIAGNOSIS — E782 Mixed hyperlipidemia: Secondary | ICD-10-CM

## 2023-10-04 DIAGNOSIS — N946 Dysmenorrhea, unspecified: Secondary | ICD-10-CM

## 2023-10-04 DIAGNOSIS — E538 Deficiency of other specified B group vitamins: Secondary | ICD-10-CM | POA: Diagnosis not present

## 2023-10-04 DIAGNOSIS — E039 Hypothyroidism, unspecified: Secondary | ICD-10-CM

## 2023-10-04 DIAGNOSIS — E559 Vitamin D deficiency, unspecified: Secondary | ICD-10-CM

## 2023-10-04 DIAGNOSIS — R7303 Prediabetes: Secondary | ICD-10-CM | POA: Diagnosis not present

## 2023-10-04 DIAGNOSIS — I10 Essential (primary) hypertension: Secondary | ICD-10-CM | POA: Diagnosis not present

## 2023-10-04 DIAGNOSIS — R5383 Other fatigue: Secondary | ICD-10-CM

## 2023-10-04 DIAGNOSIS — N921 Excessive and frequent menstruation with irregular cycle: Secondary | ICD-10-CM

## 2023-10-04 NOTE — Progress Notes (Signed)
Established Patient Office Visit  Subjective:  Patient ID: Mariah Watson, female    DOB: 1991-10-20  Age: 32 y.o. MRN: [id number]  Chief Complaint  Patient presents with   Establish Care    New patient    Abdominal pain and irregular bleeding.  Happened before and after period this month.   Has been happening before previously.   Started on the 14th, stayed for 5 days.  Started spotting again on Monday BRB   All of this started in December.  Says she's had spots in her groin as well.  Internal, a little bit smaller than a golf ball.  Sitting for extended periods hurts.   Bleeding is the most concerning thing today.  Had a physical in June, says everything there was fine.  Went to [facility].    Wake up with headaches, just about every day, at least every other day.       No other concerns at this time.   Past Medical History:  Diagnosis Date   History of tonsillectomy     Past Surgical History:  Procedure Laterality Date   TONSILLECTOMY      Social History   Socioeconomic History   Marital status: Single    Spouse name: Not on file   Number of children: Not on file   Years of education: Not on file   Highest education level: Not on file  Occupational History   Not on file  Tobacco Use   Smoking status: Former    Current packs/day: 0.00    Types: Cigarettes    Quit date: 05/08/2021    Years since quitting: 2.4   Smokeless tobacco: Never  Vaping Use   Vaping status: Never Used  Substance and Sexual Activity   Alcohol use: Not Currently   Drug use: Not Currently    Types: Marijuana   Sexual activity: Not Currently    Birth control/protection: None  Other Topics Concern   Not on file  Social History Narrative   Not on file   Social Determinants of Health   Financial Resource Strain: Not on file  Food Insecurity: Not on file  Transportation Needs: Not on file  Physical Activity: Not on file  Stress: Not on file  Social Connections: Not on  file  Intimate Partner Violence: Not At Risk (05/09/2023)   Humiliation, Afraid, Rape, and Kick questionnaire    Fear of Current or Ex-Partner: No    Emotionally Abused: No    Physically Abused: No    Sexually Abused: No    Family History  Problem Relation Age of Onset   Diabetes Mellitus II Father    Liver disease Maternal Grandmother    Cancer Maternal Grandfather    Heart Problems Paternal Grandmother    Lung cancer Paternal Grandfather     Allergies  Allergen Reactions   Shellfish Allergy Anaphylaxis    Review of Systems  Constitutional:  Positive for malaise/fatigue.  Genitourinary:        Dysmenorrhea Abnormal periods  Neurological:  Positive for headaches.  All other systems reviewed and are negative.      Objective:   BP 132/80   Pulse 90   Ht 5\' 7"  (1.702 m)   Wt 222 lb 9.6 oz (101 kg)   SpO2 97%   BMI 34.86 kg/m   Vitals:   10/04/23 1354  BP: 132/80  Pulse: 90  Height: 5\' 7"  (1.702 m)  Weight: 222 lb 9.6 oz (101 kg)  SpO2: 97%  BMI (Calculated): 34.86    Physical Exam Vitals and nursing note reviewed.  Constitutional:      Appearance: Normal appearance. She is normal weight.  HENT:     Head: Normocephalic.  Eyes:     Extraocular Movements: Extraocular movements intact.     Conjunctiva/sclera: Conjunctivae normal.     Pupils: Pupils are equal, round, and reactive to light.  Cardiovascular:     Rate and Rhythm: Normal rate.  Pulmonary:     Effort: Pulmonary effort is normal.  Neurological:     General: No focal deficit present.     Mental Status: She is alert and oriented to person, place, and time. Mental status is at baseline.  Psychiatric:        Mood and Affect: Mood normal.        Behavior: Behavior normal.        Thought Content: Thought content normal.      Results for orders placed or performed in visit on 10/04/23  Lipid panel  Result Value Ref Range   Cholesterol, Total 223 (H) 100 - 199 mg/dL   Triglycerides 91 0 -  149 mg/dL   HDL 70 >95 mg/dL   VLDL Cholesterol Cal 16 5 - 40 mg/dL   LDL Chol Calc (NIH) 621 (H) 0 - 99 mg/dL   Chol/HDL Ratio 3.2 0.0 - 4.4 ratio  VITAMIN D 25 Hydroxy (Vit-D Deficiency, Fractures)  Result Value Ref Range   Vit D, 25-Hydroxy 18.6 (L) 30.0 - 100.0 ng/mL  CMP14+EGFR  Result Value Ref Range   Glucose 80 70 - 99 mg/dL   BUN 7 6 - 20 mg/dL   Creatinine, Ser 3.08 0.57 - 1.00 mg/dL   eGFR 657 >84 ON/GEX/5.28   BUN/Creatinine Ratio 9 9 - 23   Sodium 138 134 - 144 mmol/L   Potassium 4.4 3.5 - 5.2 mmol/L   Chloride 101 96 - 106 mmol/L   CO2 24 20 - 29 mmol/L   Calcium 10.0 8.7 - 10.2 mg/dL   Total Protein 7.2 6.0 - 8.5 g/dL   Albumin 4.6 3.9 - 4.9 g/dL   Globulin, Total 2.6 1.5 - 4.5 g/dL   Bilirubin Total 0.3 0.0 - 1.2 mg/dL   Alkaline Phosphatase 51 44 - 121 IU/L   AST 22 0 - 40 IU/L   ALT 17 0 - 32 IU/L  TSH  Result Value Ref Range   TSH 0.727 0.450 - 4.500 uIU/mL  Hemoglobin A1c  Result Value Ref Range   Hgb A1c MFr Bld 5.3 4.8 - 5.6 %   Est. average glucose Bld gHb Est-mCnc 105 mg/dL  Vitamin U13  Result Value Ref Range   Vitamin B-12 648 232 - 1,245 pg/mL  CBC with Diff  Result Value Ref Range   WBC 4.2 3.4 - 10.8 x10E3/uL   RBC 4.31 3.77 - 5.28 x10E6/uL   Hemoglobin 12.4 11.1 - 15.9 g/dL   Hematocrit 24.4 01.0 - 46.6 %   MCV 89 79 - 97 fL   MCH 28.8 26.6 - 33.0 pg   MCHC 32.2 31.5 - 35.7 g/dL   RDW 27.2 53.6 - 64.4 %   Platelets 371 150 - 450 x10E3/uL   Neutrophils 49 Not Estab. %   Lymphs 41 Not Estab. %   Monocytes 7 Not Estab. %   Eos 2 Not Estab. %   Basos 1 Not Estab. %   Neutrophils Absolute 2.0 1.4 - 7.0 x10E3/uL   Lymphocytes Absolute 1.7 0.7 - 3.1 x10E3/uL  Monocytes Absolute 0.3 0.1 - 0.9 x10E3/uL   EOS (ABSOLUTE) 0.1 0.0 - 0.4 x10E3/uL   Basophils Absolute 0.0 0.0 - 0.2 x10E3/uL   Immature Granulocytes 0 Not Estab. %   Immature Grans (Abs) 0.0 0.0 - 0.1 x10E3/uL  Iron, TIBC and Ferritin Panel  Result Value Ref Range   Total  Iron Binding Capacity 325 250 - 450 ug/dL   UIBC 161 096 - 045 ug/dL   Iron 95 27 - 409 ug/dL   Iron Saturation 29 15 - 55 %   Ferritin 31 15 - 150 ng/mL  FSH+Prog+E2+SHBG  Result Value Ref Range   FSH 14.4 mIU/mL   Progesterone 1.4 ng/mL   Sex Hormone Binding 49.8 24.6 - 122.0 nmol/L   Estradiol 203.0 pg/mL  LH  Result Value Ref Range   LH 66.3 mIU/mL  Anti mullerian hormone  Result Value Ref Range   ANTI-MULLERIAN HORMONE (AMH) 3.72 ng/mL    Recent Results (from the past 2160 hour(s))  Lipid panel     Status: Abnormal   Collection Time: 10/04/23  2:26 PM  Result Value Ref Range   Cholesterol, Total 223 (H) 100 - 199 mg/dL   Triglycerides 91 0 - 149 mg/dL   HDL 70 >81 mg/dL   VLDL Cholesterol Cal 16 5 - 40 mg/dL   LDL Chol Calc (NIH) 191 (H) 0 - 99 mg/dL   Chol/HDL Ratio 3.2 0.0 - 4.4 ratio    Comment:                                   T. Chol/HDL Ratio                                             Men  Women                               1/2 Avg.Risk  3.4    3.3                                   Avg.Risk  5.0    4.4                                2X Avg.Risk  9.6    7.1                                3X Avg.Risk 23.4   11.0   VITAMIN D 25 Hydroxy (Vit-D Deficiency, Fractures)     Status: Abnormal   Collection Time: 10/04/23  2:26 PM  Result Value Ref Range   Vit D, 25-Hydroxy 18.6 (L) 30.0 - 100.0 ng/mL    Comment: Vitamin D deficiency has been defined by the Institute of Medicine and an Endocrine Society practice guideline as a level of serum 25-OH vitamin D less than 20 ng/mL (1,2). The Endocrine Society went on to further define vitamin D insufficiency as a level between 21 and 29 ng/mL (2). 1. IOM (Institute of Medicine). 2010. Dietary reference    intakes for calcium and D. [place]:  The    [employer]. 2. Holick MF, Binkley [place], Bischoff-Ferrari HA, et al.    Evaluation, treatment, and prevention of vitamin D    deficiency: an Endocrine Society  clinical practice    guideline. JCEM. 2011 Jul; 96(7):1911-30.   CMP14+EGFR     Status: None   Collection Time: 10/04/23  2:26 PM  Result Value Ref Range   Glucose 80 70 - 99 mg/dL   BUN 7 6 - 20 mg/dL   Creatinine, Ser 0.34 0.57 - 1.00 mg/dL   eGFR 742 >59 DG/LOV/5.64   BUN/Creatinine Ratio 9 9 - 23   Sodium 138 134 - 144 mmol/L   Potassium 4.4 3.5 - 5.2 mmol/L   Chloride 101 96 - 106 mmol/L   CO2 24 20 - 29 mmol/L   Calcium 10.0 8.7 - 10.2 mg/dL   Total Protein 7.2 6.0 - 8.5 g/dL   Albumin 4.6 3.9 - 4.9 g/dL   Globulin, Total 2.6 1.5 - 4.5 g/dL   Bilirubin Total 0.3 0.0 - 1.2 mg/dL   Alkaline Phosphatase 51 44 - 121 IU/L   AST 22 0 - 40 IU/L   ALT 17 0 - 32 IU/L  TSH     Status: None   Collection Time: 10/04/23  2:26 PM  Result Value Ref Range   TSH 0.727 0.450 - 4.500 uIU/mL  Hemoglobin A1c     Status: None   Collection Time: 10/04/23  2:26 PM  Result Value Ref Range   Hgb A1c MFr Bld 5.3 4.8 - 5.6 %    Comment:          Prediabetes: 5.7 - 6.4          Diabetes: >6.4          Glycemic control for adults with diabetes: <7.0    Est. average glucose Bld gHb Est-mCnc 105 mg/dL  Vitamin P32     Status: None   Collection Time: 10/04/23  2:26 PM  Result Value Ref Range   Vitamin B-12 648 232 - 1,245 pg/mL  CBC with Diff     Status: None   Collection Time: 10/04/23  2:26 PM  Result Value Ref Range   WBC 4.2 3.4 - 10.8 x10E3/uL   RBC 4.31 3.77 - 5.28 x10E6/uL   Hemoglobin 12.4 11.1 - 15.9 g/dL   Hematocrit 95.1 88.4 - 46.6 %   MCV 89 79 - 97 fL   MCH 28.8 26.6 - 33.0 pg   MCHC 32.2 31.5 - 35.7 g/dL   RDW 16.6 06.3 - 01.6 %   Platelets 371 150 - 450 x10E3/uL   Neutrophils 49 Not Estab. %   Lymphs 41 Not Estab. %   Monocytes 7 Not Estab. %   Eos 2 Not Estab. %   Basos 1 Not Estab. %   Neutrophils Absolute 2.0 1.4 - 7.0 x10E3/uL   Lymphocytes Absolute 1.7 0.7 - 3.1 x10E3/uL   Monocytes Absolute 0.3 0.1 - 0.9 x10E3/uL   EOS (ABSOLUTE) 0.1 0.0 - 0.4 x10E3/uL    Basophils Absolute 0.0 0.0 - 0.2 x10E3/uL   Immature Granulocytes 0 Not Estab. %   Immature Grans (Abs) 0.0 0.0 - 0.1 x10E3/uL  Iron, TIBC and Ferritin Panel     Status: None   Collection Time: 10/04/23  2:26 PM  Result Value Ref Range   Total Iron Binding Capacity 325 250 - 450 ug/dL   UIBC 010 932 - 355 ug/dL   Iron 95 27 - 732 ug/dL  Iron Saturation 29 15 - 55 %   Ferritin 31 15 - 150 ng/mL  FSH+Prog+E2+SHBG     Status: None   Collection Time: 10/04/23  2:26 PM  Result Value Ref Range   FSH 14.4 mIU/mL    Comment:                      Adult Female             Range                       Follicular phase      3.5 -  12.5                       Ovulation phase       4.7 -  21.5                       Luteal phase          1.7 -   7.7                       Postmenopausal       25.8 - 134.8    Progesterone 1.4 ng/mL    Comment:                      Follicular phase       0.1 -   0.9                      Luteal phase           1.8 -  23.9                      Ovulation phase        0.1 -  12.0                      Pregnant                         First trimester    11.0 -  44.3                         Second trimester   25.4 -  83.3                         Third trimester    58.7 - 214.0                      Postmenopausal         0.0 -   0.1    Sex Hormone Binding 49.8 24.6 - 122.0 nmol/L   Estradiol 203.0 pg/mL    Comment:                      Adult Female             Range                       Follicular phase     12.5 - 166.0                       Ovulation phase  85.8 - 498.0                       Luteal phase         43.8 - 211.0                       Postmenopausal       <6.0 -  54.7                      Pregnancy                       1st trimester     215.0 - >4300.0 Roche ECLIA methodology   LH     Status: None   Collection Time: 10/04/23  2:26 PM  Result Value Ref Range   LH 66.3 mIU/mL    Comment:                      Adult Female              Range                        Follicular phase      2.4 -  12.6                       Ovulation phase      14.0 -  95.6                       Luteal phase          1.0 -  11.4                       Postmenopausal        7.7 -  58.5   Anti mullerian hormone     Status: None   Collection Time: 10/04/23  2:26 PM  Result Value Ref Range   ANTI-MULLERIAN HORMONE (AMH) 3.72 ng/mL    Comment: For assays employing antibodies, the possibility exists for interference by heterophile antibodies in the samples.1  1.Kricka L.  Interferences in Immunoassays - still a threat.  Clin. Chem. 2000; 46: 1610-9604.  This test was developed and its performance characteristics determined by [facility]. It has not been cleared or approved by the Food and Drug Administration. Reference Range: Females 31 - 35y: 0.66 - 8.75 Median  3.00 AMH concentrations of >= 1.06 ng/mL is correlated with a better response to ovarian stimulation, produced more retrievable oocytes and higher odds of live birth according to Gleicher et al.  Garlon Hatchet and Sterility. 2010: 54:0981-1914.  The current AMH test method correlates with the study method with a slope of 0.94. Females at risk of ovarian hyperstimulation syndrome or polycystic ovarian syndrome (PCOS) may exhibit elevated serum AMH concentrations.   AMH levels from PCOS patients may be 2 to 5 fold higher than age-appropriate reference in terval values. Granulosa cell tumors of the ovary may secrete AMH along with other tumor markers.  Elevated AMH is not specific for malignancy, and the assay should not be used exclusively to diagnose or exclude an AMH-secreting ovarian tumor.        Assessment & Plan:   Problem List Items Addressed This Visit       Active Problems   Vitamin D deficiency, unspecified   Relevant  Orders   VITAMIN D 25 Hydroxy (Vit-D Deficiency, Fractures) (Completed)   CMP14+EGFR (Completed)   CBC with Diff (Completed)   Mixed hyperlipidemia   Relevant  Orders   Lipid panel (Completed)   CMP14+EGFR (Completed)   CBC with Diff (Completed)   Other Visit Diagnoses     B12 deficiency due to diet    -  Primary   Relevant Orders   CMP14+EGFR (Completed)   Vitamin B12 (Completed)   CBC with Diff (Completed)   Essential hypertension, benign       Relevant Orders   CMP14+EGFR (Completed)   CBC with Diff (Completed)   Hypothyroidism (acquired)       Relevant Orders   CMP14+EGFR (Completed)   TSH (Completed)   CBC with Diff (Completed)   Prediabetes       Relevant Orders   CMP14+EGFR (Completed)   Hemoglobin A1c (Completed)   CBC with Diff (Completed)   Other fatigue       Relevant Orders   Iron, TIBC and Ferritin Panel (Completed)   Menometrorrhagia       Relevant Orders   FSH+Prog+E2+SHBG (Completed)   LH (Completed)   Anti mullerian hormone (Completed)   US Pelvic Complete With Transvaginal (Completed)   Dysmenorrhea       Relevant Orders   FSH+Prog+E2+SHBG (Completed)   LH (Completed)   Anti mullerian hormone (Completed)   US Pelvic Complete With Transvaginal (Completed)      Labs today  Also ordering Pelvic Ultrasound  Will call with results/discuss in detail at follow up in 2 weeks.   Return in about 2 weeks (around 10/18/2023) for F/U.   Total time spent: 30 minutes  Miki Kins, FNP  10/04/2023   This document may have been prepared by [facility] Voice Recognition software and as such may include unintentional dictation errors.

## 2023-10-04 NOTE — Progress Notes (Incomplete)
,g  Established Patient Office Visit  Subjective:  Patient ID: Mariah Watson, female    DOB: Apr 15, 1991  Age: 32 y.o. MRN: [id number]  Chief Complaint  Patient presents with  . Establish Care    New patient    Abdominal pain and irregular bleeding.  Happened before and after period this month.   Has been happening before previously.   Started on the 14th, stayed for 5 days.  Started spotting again on Monday BRB   All of this started in December.  Says she's had spots in her groin as well.  Internal, a little bit smaller than a golf ball.  Sitting for extended periods hurts.   Bleeding is the most concerning thing today.  Had a physical in June, says everything there was fine.  Went to [facility].    Wake up with headaches, just about every day, at least every other day.       No other concerns at this time.   Past Medical History:  Diagnosis Date  . History of tonsillectomy     Past Surgical History:  Procedure Laterality Date  . TONSILLECTOMY      Social History   Socioeconomic History  . Marital status: Single    Spouse name: Not on file  . Number of children: Not on file  . Years of education: Not on file  . Highest education level: Not on file  Occupational History  . Not on file  Tobacco Use  . Smoking status: Former    Current packs/day: 0.00    Types: Cigarettes    Quit date: 05/08/2021    Years since quitting: 2.4  . Smokeless tobacco: Never  Vaping Use  . Vaping status: Never Used  Substance and Sexual Activity  . Alcohol use: Not Currently  . Drug use: Not Currently    Types: Marijuana  . Sexual activity: Yes    Birth control/protection: None  Other Topics Concern  . Not on file  Social History Narrative  . Not on file   Social Determinants of Health   Financial Resource Strain: Not on file  Food Insecurity: Not on file  Transportation Needs: Not on file  Physical Activity: Not on file  Stress: Not on file  Social Connections:  Not on file  Intimate Partner Violence: Not At Risk (05/09/2023)   Humiliation, Afraid, Rape, and Kick questionnaire   . Fear of Current or Ex-Partner: No   . Emotionally Abused: No   . Physically Abused: No   . Sexually Abused: No    Family History  Problem Relation Age of Onset  . Diabetes Mellitus II Father   . Liver disease Maternal Grandmother   . Cancer Maternal Grandfather   . Heart Problems Paternal Grandmother   . Lung cancer Paternal Grandfather     Allergies  Allergen Reactions  . Shellfish Allergy Anaphylaxis    ROS     Objective:   BP 132/80   Pulse 90   Ht 5\' 7"  (1.702 m)   Wt 222 lb 9.6 oz (101 kg)   SpO2 97%   BMI 34.86 kg/m   Vitals:   10/04/23 1354  BP: 132/80  Pulse: 90  Height: 5\' 7"  (1.702 m)  Weight: 222 lb 9.6 oz (101 kg)  SpO2: 97%  BMI (Calculated): 34.86    Physical Exam   No results found for any visits on 10/04/23.  No results found for this or any previous visit (from the past 2160 hour(s)).  Assessment & Plan:   Problem List Items Addressed This Visit   None Visit Diagnoses     B12 deficiency due to diet    -  Primary   Relevant Orders   CMP14+EGFR   Vitamin B12   CBC with Diff   Essential hypertension, benign       Relevant Orders   CMP14+EGFR   CBC with Diff   Hypothyroidism (acquired)       Relevant Orders   CMP14+EGFR   TSH   CBC with Diff   Prediabetes       Relevant Orders   CMP14+EGFR   Hemoglobin A1c   CBC with Diff   Mixed hyperlipidemia       Relevant Orders   Lipid panel   CMP14+EGFR   CBC with Diff   Vitamin D deficiency, unspecified       Relevant Orders   VITAMIN D 25 Hydroxy (Vit-D Deficiency, Fractures)   CMP14+EGFR   CBC with Diff   Other fatigue       Relevant Orders   Iron, TIBC and Ferritin Panel   Menometrorrhagia       Relevant Orders   FSH+Prog+E2+SHBG   LH   Anti mullerian hormone   Dysmenorrhea       Relevant Orders   FSH+Prog+E2+SHBG   LH   Anti mullerian  hormone       No follow-ups on file.   Total time spent: {AMA time spent:29001} minutes  Miki Kins, FNP  10/04/2023   This document may have been prepared by [facility] Voice Recognition software and as such may include unintentional dictation errors.

## 2023-10-09 ENCOUNTER — Inpatient Hospital Stay
Admission: RE | Admit: 2023-10-09 | Discharge: 2023-10-09 | Discharge status: Home / Self Care | Payer: Medicaid Other | Source: Ambulatory Visit | Attending: Family | Admitting: Family

## 2023-10-09 DIAGNOSIS — N921 Excessive and frequent menstruation with irregular cycle: Secondary | ICD-10-CM

## 2023-10-09 DIAGNOSIS — N946 Dysmenorrhea, unspecified: Secondary | ICD-10-CM

## 2023-10-16 LAB — CBC WITH DIFFERENTIAL/PLATELET
Basophils Absolute: 0 10*3/uL (ref 0.0–0.2)
Basos: 1 %
EOS (ABSOLUTE): 0.1 10*3/uL (ref 0.0–0.4)
Eos: 2 %
Hematocrit: 38.5 % (ref 34.0–46.6)
Hemoglobin: 12.4 g/dL (ref 11.1–15.9)
Immature Grans (Abs): 0 10*3/uL (ref 0.0–0.1)
Immature Granulocytes: 0 %
Lymphocytes Absolute: 1.7 10*3/uL (ref 0.7–3.1)
Lymphs: 41 %
MCH: 28.8 pg (ref 26.6–33.0)
MCHC: 32.2 g/dL (ref 31.5–35.7)
MCV: 89 fL (ref 79–97)
Monocytes Absolute: 0.3 10*3/uL (ref 0.1–0.9)
Monocytes: 7 %
Neutrophils Absolute: 2 10*3/uL (ref 1.4–7.0)
Neutrophils: 49 %
Platelets: 371 10*3/uL (ref 150–450)
RBC: 4.31 x10E6/uL (ref 3.77–5.28)
RDW: 12.6 % (ref 11.7–15.4)
WBC: 4.2 10*3/uL (ref 3.4–10.8)

## 2023-10-16 LAB — IRON,TIBC AND FERRITIN PANEL
Ferritin: 31 ng/mL (ref 15–150)
Iron Saturation: 29 % (ref 15–55)
Iron: 95 ug/dL (ref 27–159)
Total Iron Binding Capacity: 325 ug/dL (ref 250–450)
UIBC: 230 ug/dL (ref 131–425)

## 2023-10-16 LAB — CMP14+EGFR
ALT: 17 [IU]/L (ref 0–32)
AST: 22 [IU]/L (ref 0–40)
Albumin: 4.6 g/dL (ref 3.9–4.9)
Alkaline Phosphatase: 51 [IU]/L (ref 44–121)
BUN/Creatinine Ratio: 9 (ref 9–23)
BUN: 7 mg/dL (ref 6–20)
Bilirubin Total: 0.3 mg/dL (ref 0.0–1.2)
CO2: 24 mmol/L (ref 20–29)
Calcium: 10 mg/dL (ref 8.7–10.2)
Chloride: 101 mmol/L (ref 96–106)
Creatinine, Ser: 0.78 mg/dL (ref 0.57–1.00)
Globulin, Total: 2.6 g/dL (ref 1.5–4.5)
Glucose: 80 mg/dL (ref 70–99)
Potassium: 4.4 mmol/L (ref 3.5–5.2)
Sodium: 138 mmol/L (ref 134–144)
Total Protein: 7.2 g/dL (ref 6.0–8.5)
eGFR: 103 mL/min/{1.73_m2} (ref >59)

## 2023-10-16 LAB — LUTEINIZING HORMONE: LH: 66.3 m[IU]/mL

## 2023-10-16 LAB — LIPID PANEL
Chol/HDL Ratio: 3.2 ratio (ref 0.0–4.4)
Cholesterol, Total: 223 mg/dL — ABNORMAL HIGH (ref 100–199)
HDL: 70 mg/dL (ref >39)
LDL Chol Calc (NIH): 137 mg/dL — ABNORMAL HIGH (ref 0–99)
Triglycerides: 91 mg/dL (ref 0–149)
VLDL Cholesterol Cal: 16 mg/dL (ref 5–40)

## 2023-10-16 LAB — VITAMIN B12: Vitamin B-12: 648 pg/mL (ref 232–1245)

## 2023-10-16 LAB — HEMOGLOBIN A1C
Est. average glucose Bld gHb Est-mCnc: 105 mg/dL
Hgb A1c MFr Bld: 5.3 % (ref 4.8–5.6)

## 2023-10-16 LAB — FSH+PROG+E2+SHBG
Estradiol: 203 pg/mL
FSH: 14.4 m[IU]/mL
Progesterone: 1.4 ng/mL
Sex Hormone Binding: 49.8 nmol/L (ref 24.6–122.0)

## 2023-10-16 LAB — TSH: TSH: 0.727 u[IU]/mL (ref 0.450–4.500)

## 2023-10-16 LAB — VITAMIN D 25 HYDROXY (VIT D DEFICIENCY, FRACTURES): Vit D, 25-Hydroxy: 18.6 ng/mL — ABNORMAL LOW (ref 30.0–100.0)

## 2023-10-16 LAB — ANTI MULLERIAN HORMONE: ANTI-MULLERIAN HORMONE (AMH): 3.72 ng/mL

## 2023-10-16 NOTE — Progress Notes (Signed)
Basically, it looks like they found a small spot on the lining of her uterus that is concerning.  We need to get the OB/GYN so that they can look at it closer, they may want to do a biopsy or something similar, but I'm not sure what they would want to do.   It is possible that it could be cancerous, but it is also very possible that it could be a fibroid or something similar that is in the lining of the uterus.  Basically, we can't really know until we get the further testing, but I would like to  go ahead and get that started as soon as possible so we can figure out what's going on.   If she has further questions, we can discuss them in detail next week at her follow up.

## 2023-10-22 ENCOUNTER — Ambulatory Visit: Payer: Medicaid Other | Admitting: Family

## 2023-10-22 ENCOUNTER — Encounter: Payer: Self-pay | Admitting: Family

## 2023-10-22 VITALS — BP 128/76 | HR 91 | Ht 67.0 in | Wt 224.6 lb

## 2023-10-22 DIAGNOSIS — E782 Mixed hyperlipidemia: Secondary | ICD-10-CM | POA: Insufficient documentation

## 2023-10-22 DIAGNOSIS — E559 Vitamin D deficiency, unspecified: Secondary | ICD-10-CM | POA: Insufficient documentation

## 2023-10-22 DIAGNOSIS — Z0130 Encounter for examination of blood pressure without abnormal findings: Secondary | ICD-10-CM

## 2023-10-22 DIAGNOSIS — R9389 Abnormal findings on diagnostic imaging of other specified body structures: Secondary | ICD-10-CM | POA: Diagnosis not present

## 2023-10-22 DIAGNOSIS — N760 Acute vaginitis: Secondary | ICD-10-CM

## 2023-10-22 MED ORDER — VITAMIN D (ERGOCALCIFEROL) 1.25 MG (50000 UNIT) PO CAPS: 50000.0000 [IU] | ORAL_CAPSULE | ORAL | 1 refills | Status: DC

## 2023-10-22 MED ORDER — METRONIDAZOLE 500 MG PO TABS: 500.0000 mg | ORAL_TABLET | Freq: Three times a day (TID) | ORAL | 0 refills | Status: DC

## 2023-10-22 NOTE — Assessment & Plan Note (Signed)
Vitamin D was <20 Patient counseled that this could be affecting his energy level and bone health.   Sending supplementation for pt.  Will recheck at follow up.  

## 2023-10-22 NOTE — Assessment & Plan Note (Signed)
Patient educated on dietary and lifestyle modifications to improve lipid control.  She opts to try these prior to starting medication.  Will recheck labs at follow up and will modify as needed based on labwork results.

## 2023-10-22 NOTE — Progress Notes (Signed)
Established Patient Office Visit  Subjective:  Patient ID: Mariah Watson, female    DOB: February 22, 1991  Age: 32 y.o. MRN: [id number]  Chief Complaint  Patient presents with   Follow-up    2 week follow up    Pt. Here today for 2 week n/p follow up.   Had labs done at that time, so we will review in detail today.   Labs: Vitamin D is low, needs supplement.  LDL Cholesterol is elevated. Patient does admit that she has had a lot of cheese and dairy products lately.   She did have her ultrasound, which found a lesion on her endometrium. This was described as a "small echogenic mass" in the endometrium.  She will need a referral to OB/GYN to have this further evaluated.   No other concerns today.      No other concerns at this time.   Past Medical History:  Diagnosis Date   History of tonsillectomy     Past Surgical History:  Procedure Laterality Date   TONSILLECTOMY      Social History   Socioeconomic History   Marital status: Single    Spouse name: Not on file   Number of children: Not on file   Years of education: Not on file   Highest education level: Not on file  Occupational History   Not on file  Tobacco Use   Smoking status: Former    Current packs/day: 0.00    Types: Cigarettes    Quit date: 05/08/2021    Years since quitting: 2.4   Smokeless tobacco: Never  Vaping Use   Vaping status: Never Used  Substance and Sexual Activity   Alcohol use: Not Currently   Drug use: Not Currently    Types: Marijuana   Sexual activity: Yes    Birth control/protection: None  Other Topics Concern   Not on file  Social History Narrative   Not on file   Social Determinants of Health   Financial Resource Strain: Not on file  Food Insecurity: Not on file  Transportation Needs: Not on file  Physical Activity: Not on file  Stress: Not on file  Social Connections: Not on file  Intimate Partner Violence: Not At Risk (05/09/2023)   Humiliation, Afraid, Rape, and Kick  questionnaire    Fear of Current or Ex-Partner: No    Emotionally Abused: No    Physically Abused: No    Sexually Abused: No    Family History  Problem Relation Age of Onset   Diabetes Mellitus II Father    Liver disease Maternal Grandmother    Cancer Maternal Grandfather    Heart Problems Paternal Grandmother    Lung cancer Paternal Grandfather     Allergies  Allergen Reactions   Shellfish Allergy Anaphylaxis    Review of Systems  Genitourinary:        Heavy periods, clots, lots of pain.   Psychiatric/Behavioral:  The patient is nervous/anxious.   All other systems reviewed and are negative.      Objective:   BP 128/76   Pulse 91   Ht 5\' 7"  (1.702 m)   Wt 224 lb 9.6 oz (101.9 kg)   SpO2 98%   BMI 35.18 kg/m   Vitals:   10/22/23 1504  BP: 128/76  Pulse: 91  Height: 5\' 7"  (1.702 m)  Weight: 224 lb 9.6 oz (101.9 kg)  SpO2: 98%  BMI (Calculated): 35.17    Physical Exam Vitals and nursing note reviewed.  Constitutional:  Appearance: Normal appearance. She is normal weight.  HENT:     Head: Normocephalic.  Eyes:     Extraocular Movements: Extraocular movements intact.     Conjunctiva/sclera: Conjunctivae normal.     Pupils: Pupils are equal, round, and reactive to light.  Cardiovascular:     Rate and Rhythm: Normal rate.  Pulmonary:     Effort: Pulmonary effort is normal.  Neurological:     General: No focal deficit present.     Mental Status: She is alert and oriented to person, place, and time. Mental status is at baseline.  Psychiatric:        Mood and Affect: Mood normal.        Behavior: Behavior normal.        Thought Content: Thought content normal.        Judgment: Judgment normal.      No results found for any visits on 10/22/23.  Recent Results (from the past 2160 hour(s))  Lipid panel     Status: Abnormal   Collection Time: 10/04/23  2:26 PM  Result Value Ref Range   Cholesterol, Total 223 (H) 100 - 199 mg/dL   Triglycerides  91 0 - 149 mg/dL   HDL 70 >78 mg/dL   VLDL Cholesterol Cal 16 5 - 40 mg/dL   LDL Chol Calc (NIH) 469 (H) 0 - 99 mg/dL   Chol/HDL Ratio 3.2 0.0 - 4.4 ratio    Comment:                                   T. Chol/HDL Ratio                                             Men  Women                               1/2 Avg.Risk  3.4    3.3                                   Avg.Risk  5.0    4.4                                2X Avg.Risk  9.6    7.1                                3X Avg.Risk 23.4   11.0   VITAMIN D 25 Hydroxy (Vit-D Deficiency, Fractures)     Status: Abnormal   Collection Time: 10/04/23  2:26 PM  Result Value Ref Range   Vit D, 25-Hydroxy 18.6 (L) 30.0 - 100.0 ng/mL    Comment: Vitamin D deficiency has been defined by the Institute of Medicine and an Endocrine Society practice guideline as a level of serum 25-OH vitamin D less than 20 ng/mL (1,2). The Endocrine Society went on to further define vitamin D insufficiency as a level between 21 and 29 ng/mL (2). 1. IOM (Institute of Medicine). 2010. Dietary reference    intakes for calcium and D. [place]: The  [employer]. 2. Holick MF, Binkley [place], Bischoff-Ferrari HA, et al.    Evaluation, treatment, and prevention of vitamin D    deficiency: an Endocrine Society clinical practice    guideline. JCEM. 2011 Jul; 96(7):1911-30.   CMP14+EGFR     Status: None   Collection Time: 10/04/23  2:26 PM  Result Value Ref Range   Glucose 80 70 - 99 mg/dL   BUN 7 6 - 20 mg/dL   Creatinine, Ser 8.29 0.57 - 1.00 mg/dL   eGFR 562 >13 YQ/MVH/8.46   BUN/Creatinine Ratio 9 9 - 23   Sodium 138 134 - 144 mmol/L   Potassium 4.4 3.5 - 5.2 mmol/L   Chloride 101 96 - 106 mmol/L   CO2 24 20 - 29 mmol/L   Calcium 10.0 8.7 - 10.2 mg/dL   Total Protein 7.2 6.0 - 8.5 g/dL   Albumin 4.6 3.9 - 4.9 g/dL   Globulin, Total 2.6 1.5 - 4.5 g/dL   Bilirubin Total 0.3 0.0 - 1.2 mg/dL   Alkaline Phosphatase 51 44 - 121 IU/L   AST 22 0 - 40  IU/L   ALT 17 0 - 32 IU/L  TSH     Status: None   Collection Time: 10/04/23  2:26 PM  Result Value Ref Range   TSH 0.727 0.450 - 4.500 uIU/mL  Hemoglobin A1c     Status: None   Collection Time: 10/04/23  2:26 PM  Result Value Ref Range   Hgb A1c MFr Bld 5.3 4.8 - 5.6 %    Comment:          Prediabetes: 5.7 - 6.4          Diabetes: >6.4          Glycemic control for adults with diabetes: <7.0    Est. average glucose Bld gHb Est-mCnc 105 mg/dL  Vitamin N62     Status: None   Collection Time: 10/04/23  2:26 PM  Result Value Ref Range   Vitamin B-12 648 232 - 1,245 pg/mL  CBC with Diff     Status: None   Collection Time: 10/04/23  2:26 PM  Result Value Ref Range   WBC 4.2 3.4 - 10.8 x10E3/uL   RBC 4.31 3.77 - 5.28 x10E6/uL   Hemoglobin 12.4 11.1 - 15.9 g/dL   Hematocrit 95.2 84.1 - 46.6 %   MCV 89 79 - 97 fL   MCH 28.8 26.6 - 33.0 pg   MCHC 32.2 31.5 - 35.7 g/dL   RDW 32.4 40.1 - 02.7 %   Platelets 371 150 - 450 x10E3/uL   Neutrophils 49 Not Estab. %   Lymphs 41 Not Estab. %   Monocytes 7 Not Estab. %   Eos 2 Not Estab. %   Basos 1 Not Estab. %   Neutrophils Absolute 2.0 1.4 - 7.0 x10E3/uL   Lymphocytes Absolute 1.7 0.7 - 3.1 x10E3/uL   Monocytes Absolute 0.3 0.1 - 0.9 x10E3/uL   EOS (ABSOLUTE) 0.1 0.0 - 0.4 x10E3/uL   Basophils Absolute 0.0 0.0 - 0.2 x10E3/uL   Immature Granulocytes 0 Not Estab. %   Immature Grans (Abs) 0.0 0.0 - 0.1 x10E3/uL  Iron, TIBC and Ferritin Panel     Status: None   Collection Time: 10/04/23  2:26 PM  Result Value Ref Range   Total Iron Binding Capacity 325 250 - 450 ug/dL   UIBC 253 664 - 403 ug/dL   Iron 95 27 - 474 ug/dL   Iron Saturation 29 15 -  55 %   Ferritin 31 15 - 150 ng/mL  FSH+Prog+E2+SHBG     Status: None   Collection Time: 10/04/23  2:26 PM  Result Value Ref Range   FSH 14.4 mIU/mL    Comment:                      Adult Female             Range                       Follicular phase      3.5 -  12.5                        Ovulation phase       4.7 -  21.5                       Luteal phase          1.7 -   7.7                       Postmenopausal       25.8 - 134.8    Progesterone 1.4 ng/mL    Comment:                      Follicular phase       0.1 -   0.9                      Luteal phase           1.8 -  23.9                      Ovulation phase        0.1 -  12.0                      Pregnant                         First trimester    11.0 -  44.3                         Second trimester   25.4 -  83.3                         Third trimester    58.7 - 214.0                      Postmenopausal         0.0 -   0.1    Sex Hormone Binding 49.8 24.6 - 122.0 nmol/L   Estradiol 203.0 pg/mL    Comment:                      Adult Female             Range                       Follicular phase     12.5 - 166.0                       Ovulation phase      85.8 -  498.0                       Luteal phase         43.8 - 211.0                       Postmenopausal       <6.0 -  54.7                      Pregnancy                       1st trimester     215.0 - >4300.0 Roche ECLIA methodology   LH     Status: None   Collection Time: 10/04/23  2:26 PM  Result Value Ref Range   LH 66.3 mIU/mL    Comment:                      Adult Female              Range                       Follicular phase      2.4 -  12.6                       Ovulation phase      14.0 -  95.6                       Luteal phase          1.0 -  11.4                       Postmenopausal        7.7 -  58.5   Anti mullerian hormone     Status: None   Collection Time: 10/04/23  2:26 PM  Result Value Ref Range   ANTI-MULLERIAN HORMONE (AMH) 3.72 ng/mL    Comment: For assays employing antibodies, the possibility exists for interference by heterophile antibodies in the samples.1  1.Kricka L.  Interferences in Immunoassays - still a threat.  Clin. Chem. 2000; 46: 1610-9604.  This test was developed and its performance  characteristics determined by [facility]. It has not been cleared or approved by the Food and Drug Administration. Reference Range: Females 31 - 35y: 0.66 - 8.75 Median  3.00 AMH concentrations of >= 1.06 ng/mL is correlated with a better response to ovarian stimulation, produced more retrievable oocytes and higher odds of live birth according to Gleicher et al.  Garlon Hatchet and Sterility. 2010: 54:0981-1914.  The current AMH test method correlates with the study method with a slope of 0.94. Females at risk of ovarian hyperstimulation syndrome or polycystic ovarian syndrome (PCOS) may exhibit elevated serum AMH concentrations.   AMH levels from PCOS patients may be 2 to 5 fold higher than age-appropriate reference in terval values. Granulosa cell tumors of the ovary may secrete AMH along with other tumor markers.  Elevated AMH is not specific for malignancy, and the assay should not be used exclusively to diagnose or exclude an AMH-secreting ovarian tumor.        Assessment & Plan:   Problem List Items Addressed This Visit       Active Problems   Vitamin D deficiency, unspecified    Vitamin D  was <20 Patient counseled that this could be affecting his energy level and bone health.   Sending supplementation for pt.  Will recheck at follow up.        Mixed hyperlipidemia    Patient educated on dietary and lifestyle modifications to improve lipid control.  She opts to try these prior to starting medication.  Will recheck labs at follow up and will modify as needed based on labwork results.       Other Visit Diagnoses     Abnormal finding on ultrasound    -  Primary   sending referral to OB/GYN for pt.  Contact infomation given. I did let her know they will contact her to schedule.   Relevant Orders   Ambulatory referral to Obstetrics / Gynecology   Acute vaginitis       Sending Metronidazole for pt.  Will recheck at follow up as needed.       Return in about 3  months (around 01/22/2024) for F/U.   Total time spent: 20 minutes  Miki Kins, FNP  10/22/2023   This document may have been prepared by [facility] Voice Recognition software and as such may include unintentional dictation errors.

## 2023-10-31 ENCOUNTER — Encounter: Payer: Self-pay | Admitting: Obstetrics and Gynecology

## 2023-10-31 ENCOUNTER — Ambulatory Visit: Payer: Medicaid Other | Admitting: Obstetrics and Gynecology

## 2023-10-31 ENCOUNTER — Telehealth: Payer: Self-pay

## 2023-10-31 VITALS — BP 124/83 | HR 81 | Ht 67.0 in | Wt 225.8 lb

## 2023-10-31 DIAGNOSIS — N939 Abnormal uterine and vaginal bleeding, unspecified: Secondary | ICD-10-CM | POA: Diagnosis not present

## 2023-10-31 DIAGNOSIS — Z7689 Persons encountering health services in other specified circumstances: Secondary | ICD-10-CM

## 2023-10-31 NOTE — Progress Notes (Signed)
Patient presents today due to abnormal bleeding and recent ultrasound. She states this bleeding has been an issue for a year now. Reports bleeding between cycles randomly, varies between clots to moderate bleeding as well as having abdominal pain.

## 2023-10-31 NOTE — Telephone Encounter (Signed)
Patient states she was seen this morning. She is inquiring if she can have a telephone appointment rather than in person for next visit. Advised her appointment has to be in person in order for her to sign procedure consent. Also, per today's visit note, she may be having and Endometrial Biopsy at that visit prior to surgery. Patient expresses understanding and will keep 11/08/23 appointment.

## 2023-10-31 NOTE — Progress Notes (Signed)
HPI:      [name] is a 32 y.o. G2P1011 who LMP was Patient's last menstrual period was 10/18/2023.  Subjective:   She presents today because she has had dysfunctional uterine bleeding over the last year.  She reports that prior to that her cycles were regular.  But her bleeding has become irregular with bleeding before her period and then a week after her period.  She recently had a pelvic ultrasound which shows a mass in the endometrium likely consistent with polyp.  She presents today to discuss this bleeding and her polyp. She is not currently sexually active and has never used any birth control.    Hx: The following portions of the patient's history were reviewed and updated as appropriate:             She  has a past medical history of History of tonsillectomy. She does not have any pertinent problems on file. She  has a past surgical history that includes Tonsillectomy. Her family history includes Cancer in her maternal grandfather; Diabetes Mellitus II in her father; Heart Problems in her paternal grandmother; Liver disease in her maternal grandmother; Lung cancer in her paternal grandfather. She  reports that she quit smoking about 2 years ago. Her smoking use included cigarettes. She has never used smokeless tobacco. She reports that she does not currently use alcohol. She reports that she does not currently use drugs after having used the following drugs: Marijuana. She has a current medication list which includes the following prescription(s): vitamin d (ergocalciferol). She is allergic to shellfish allergy.       Review of Systems:  Review of Systems  Constitutional: Denied constitutional symptoms, night sweats, recent illness, fatigue, fever, insomnia and weight loss.  Eyes: Denied eye symptoms, eye pain, photophobia, vision change and visual disturbance.  Ears/Nose/Throat/Neck: Denied ear, nose, throat or neck symptoms, hearing loss, nasal discharge, sinus congestion  and sore throat.  Cardiovascular: Denied cardiovascular symptoms, arrhythmia, chest pain/pressure, edema, exercise intolerance, orthopnea and palpitations.  Respiratory: Denied pulmonary symptoms, asthma, pleuritic pain, productive sputum, cough, dyspnea and wheezing.  Gastrointestinal: Denied, gastro-esophageal reflux, melena, nausea and vomiting.  Genitourinary: See HPI for additional information.  Musculoskeletal: Denied musculoskeletal symptoms, stiffness, swelling, muscle weakness and myalgia.  Dermatologic: Denied dermatology symptoms, rash and scar.  Neurologic: Denied neurology symptoms, dizziness, headache, neck pain and syncope.  Psychiatric: Denied psychiatric symptoms, anxiety and depression.  Endocrine: Denied endocrine symptoms including hot flashes and night sweats.   Meds:   Current Outpatient Medications on File Prior to Visit  Medication Sig Dispense Refill   Vitamin D, Ergocalciferol, (DRISDOL) 1.25 MG (50000 UNIT) CAPS capsule Take 1 capsule (50,000 Units total) by mouth every 7 (seven) days. 12 capsule 1   No current facility-administered medications on file prior to visit.      Objective:     Vitals:   10/31/23 0816  BP: 124/83  Pulse: 81   Filed Weights   10/31/23 0816  Weight: 225 lb 12.8 oz (102.4 kg)              Pelvic ultrasound discussed directly with the patient reviewed.          Assessment:    G2P1011 Patient Active Problem List   Diagnosis Date Noted   Vitamin D deficiency, unspecified 10/22/2023   Mixed hyperlipidemia 10/22/2023   HSV (herpes simplex virus) infection 05/09/2023   Obesity (BMI 30-39.9) 03/15/2017     1. Establishing care with new doctor, encounter  for   2. Abnormal uterine bleeding     Likely secondary to endometrial polyp as noted on ultrasound.   Plan:            1.  Discussed endometrial polyps in detail.  Management with hysteroscopy D&C versus endometrial sampling followed by hysteroscopy D&C discussed.   Risks and benefits of each discussed.  All questions answered.  Patient will schedule a preop appointment in 1 to 2 weeks for surgery in the early part of January. Orders No orders of the defined types were placed in this encounter.   No orders of the defined types were placed in this encounter.     F/U  Return in about 1 week (around 11/07/2023). I spent 31 minutes involved in the care of this patient preparing to see the patient by obtaining and reviewing her medical history (including labs, imaging tests and prior procedures), documenting clinical information in the electronic health record (EHR), counseling and coordinating care plans, writing and sending prescriptions, ordering tests or procedures and in direct communicating with the patient and medical staff discussing pertinent items from her history and physical exam.  Elonda Husky, M.D. 10/31/2023 8:37 AM

## 2023-11-08 ENCOUNTER — Encounter: Payer: Self-pay | Admitting: Obstetrics and Gynecology

## 2023-11-08 ENCOUNTER — Ambulatory Visit: Payer: Medicaid Other | Admitting: Obstetrics and Gynecology

## 2023-11-08 VITALS — BP 102/71 | HR 81 | Ht 67.0 in | Wt 229.3 lb

## 2023-11-08 DIAGNOSIS — N840 Polyp of corpus uteri: Secondary | ICD-10-CM

## 2023-11-08 DIAGNOSIS — Z01818 Encounter for other preprocedural examination: Secondary | ICD-10-CM

## 2023-11-08 NOTE — Progress Notes (Signed)
Patient presents today for a pre-op exam prior to polypectomy. She states no additional concerns today.

## 2023-11-08 NOTE — Progress Notes (Signed)
PRE-OPERATIVE HISTORY AND PHYSICAL EXAM  PCP:  Miki Kins, FNP Subjective:   HPI:  Mariah Watson is a 32 y.o. G2P1011.  Patient's last menstrual period was 10/18/2023.  She presents today for a pre-op discussion and PE.  She has the following symptoms: Dysfunctional uterine bleeding-ultrasound reveals endometrial polyp  Review of Systems:   Constitutional: Denied constitutional symptoms, night sweats, recent illness, fatigue, fever, insomnia and weight loss.  Eyes: Denied eye symptoms, eye pain, photophobia, vision change and visual disturbance.  Ears/Nose/Throat/Neck: Denied ear, nose, throat or neck symptoms, hearing loss, nasal discharge, sinus congestion and sore throat.  Cardiovascular: Denied cardiovascular symptoms, arrhythmia, chest pain/pressure, edema, exercise intolerance, orthopnea and palpitations.  Respiratory: Denied pulmonary symptoms, asthma, pleuritic pain, productive sputum, cough, dyspnea and wheezing.  Gastrointestinal: Denied, gastro-esophageal reflux, melena, nausea and vomiting.  Genitourinary: See HPI for additional information.  Musculoskeletal: Denied musculoskeletal symptoms, stiffness, swelling, muscle weakness and myalgia.  Dermatologic: Denied dermatology symptoms, rash and scar.  Neurologic: Denied neurology symptoms, dizziness, headache, neck pain and syncope.  Psychiatric: Denied psychiatric symptoms, anxiety and depression.  Endocrine: Denied endocrine symptoms including hot flashes and night sweats.   OB History  Gravida Para Term Preterm AB Living  2 1 1   1 1   SAB IAB Ectopic Multiple Live Births          1    # Outcome Date GA Lbr Len/2nd Weight Sex Type Anes PTL Lv  2 AB           1 Term             Past Medical History:  Diagnosis Date   History of tonsillectomy     Past Surgical History:  Procedure Laterality Date   TONSILLECTOMY        SOCIAL HISTORY:  Social History   Tobacco Use  Smoking Status Former    Current packs/day: 0.00   Types: Cigarettes   Quit date: 05/08/2021   Years since quitting: 2.5  Smokeless Tobacco Never   Social History   Substance and Sexual Activity  Alcohol Use Not Currently    Social History   Substance and Sexual Activity  Drug Use Not Currently   Types: Marijuana    Family History  Problem Relation Age of Onset   Diabetes Mellitus II Father    Liver disease Maternal Grandmother    Cancer Maternal Grandfather    Heart Problems Paternal Grandmother    Lung cancer Paternal Grandfather     ALLERGIES:  Shellfish allergy  MEDS:   No current outpatient medications on file prior to visit.   No current facility-administered medications on file prior to visit.    No orders of the defined types were placed in this encounter.    Physical examination BP 102/71   Pulse 81   Ht 5\' 7"  (1.702 m)   Wt 229 lb 4.8 oz (104 kg)   LMP 10/18/2023   BMI 35.91 kg/m   General NAD, Conversant  HEENT Atraumatic; Op clear with mmm.  Normo-cephalic.  Anicteric sclerae  Thyroid/Neck Smooth without nodularity or enlargement. Normal ROM.  Neck Supple.  Skin No rashes, lesions or ulceration. Normal palpated skin turgor. No nodularity.  Breasts: No masses or discharge.  Symmetric.  No axillary adenopathy.  Lungs: Clear to auscultation.No rales or wheezes. Normal Respiratory effort, no retractions.  Heart: NSR.  No murmurs or rubs appreciated. No peripheral edema  Abdomen: Soft.  Non-tender.  No masses.  No HSM. No hernia  Extremities: Moves all appropriately.  Normal ROM for age. No lymphadenopathy.  Neuro: Oriented to PPT.  Normal mood. Normal affect.     Pelvic:   Vulva: Normal appearance.  No lesions.  Vagina: No lesions or abnormalities noted.  Support: Normal pelvic support.  Urethra No masses tenderness or scarring.  Meatus Normal size without lesions or prolapse.  Cervix: Normal ectropion.  No lesions.  Anus: Normal exam.  No lesions.  Perineum: Normal  exam.  No lesions.        Bimanual   Uterus: Normal size.  Non-tender.  Mobile.  AV.  Adnexae: No masses.  Non-tender to palpation.  Cul-de-sac: Negative for abnormality.   Assessment:   G2P1011 Patient Active Problem List   Diagnosis Date Noted   Vitamin D deficiency, unspecified 10/22/2023   Mixed hyperlipidemia 10/22/2023   HSV (herpes simplex virus) infection 05/09/2023   Obesity (BMI 30-39.9) 03/15/2017    1. Pre-op exam   2. Endometrial polyp      Plan:   Orders: No orders of the defined types were placed in this encounter.    1.  Hysteroscopy D&C  Pre-op discussions regarding Risks and Benefits of her scheduled surgery.  D&C The procedure and the risks and benefits of dilation and curettage/evacuation have been explained to the patient.  The specific risks of bleeding, infection, anesthesia, uterine perforation, and damage to bowel or bladder  have been specifically discussed.  I have answered all of her questions and I believe that she has an adequate and informed understanding of this procedure.   Elonda Husky, M.D. 11/08/2023 1:34 PM

## 2023-11-08 NOTE — H&P (Signed)
PRE-OPERATIVE HISTORY AND PHYSICAL EXAM  PCP:  Miki Kins, FNP Subjective:   HPI:  Mariah Watson is a 32 y.o. G2P1011.  Patient's last menstrual period was 10/18/2023.  She presents today for a pre-op discussion and PE.  She has the following symptoms: Dysfunctional uterine bleeding-ultrasound reveals endometrial polyp  Review of Systems:   Constitutional: Denied constitutional symptoms, night sweats, recent illness, fatigue, fever, insomnia and weight loss.  Eyes: Denied eye symptoms, eye pain, photophobia, vision change and visual disturbance.  Ears/Nose/Throat/Neck: Denied ear, nose, throat or neck symptoms, hearing loss, nasal discharge, sinus congestion and sore throat.  Cardiovascular: Denied cardiovascular symptoms, arrhythmia, chest pain/pressure, edema, exercise intolerance, orthopnea and palpitations.  Respiratory: Denied pulmonary symptoms, asthma, pleuritic pain, productive sputum, cough, dyspnea and wheezing.  Gastrointestinal: Denied, gastro-esophageal reflux, melena, nausea and vomiting.  Genitourinary: See HPI for additional information.  Musculoskeletal: Denied musculoskeletal symptoms, stiffness, swelling, muscle weakness and myalgia.  Dermatologic: Denied dermatology symptoms, rash and scar.  Neurologic: Denied neurology symptoms, dizziness, headache, neck pain and syncope.  Psychiatric: Denied psychiatric symptoms, anxiety and depression.  Endocrine: Denied endocrine symptoms including hot flashes and night sweats.   OB History  Gravida Para Term Preterm AB Living  2 1 1   1 1   SAB IAB Ectopic Multiple Live Births          1    # Outcome Date GA Lbr Len/2nd Weight Sex Type Anes PTL Lv  2 AB           1 Term             Past Medical History:  Diagnosis Date   History of tonsillectomy     Past Surgical History:  Procedure Laterality Date   TONSILLECTOMY        SOCIAL HISTORY:  Social History   Tobacco Use  Smoking Status Former    Current packs/day: 0.00   Types: Cigarettes   Quit date: 05/08/2021   Years since quitting: 2.5  Smokeless Tobacco Never   Social History   Substance and Sexual Activity  Alcohol Use Not Currently    Social History   Substance and Sexual Activity  Drug Use Not Currently   Types: Marijuana    Family History  Problem Relation Age of Onset   Diabetes Mellitus II Father    Liver disease Maternal Grandmother    Cancer Maternal Grandfather    Heart Problems Paternal Grandmother    Lung cancer Paternal Grandfather     ALLERGIES:  Shellfish allergy  MEDS:   No current outpatient medications on file prior to visit.   No current facility-administered medications on file prior to visit.    No orders of the defined types were placed in this encounter.    Physical examination BP 102/71   Pulse 81   Ht 5\' 7"  (1.702 m)   Wt 229 lb 4.8 oz (104 kg)   LMP 10/18/2023   BMI 35.91 kg/m   General NAD, Conversant  HEENT Atraumatic; Op clear with mmm.  Normo-cephalic.  Anicteric sclerae  Thyroid/Neck Smooth without nodularity or enlargement. Normal ROM.  Neck Supple.  Skin No rashes, lesions or ulceration. Normal palpated skin turgor. No nodularity.  Breasts: No masses or discharge.  Symmetric.  No axillary adenopathy.  Lungs: Clear to auscultation.No rales or wheezes. Normal Respiratory effort, no retractions.  Heart: NSR.  No murmurs or rubs appreciated. No peripheral edema  Abdomen: Soft.  Non-tender.  No masses.  No HSM. No hernia  Extremities: Moves all appropriately.  Normal ROM for age. No lymphadenopathy.  Neuro: Oriented to PPT.  Normal mood. Normal affect.     Pelvic:   Vulva: Normal appearance.  No lesions.  Vagina: No lesions or abnormalities noted.  Support: Normal pelvic support.  Urethra No masses tenderness or scarring.  Meatus Normal size without lesions or prolapse.  Cervix: Normal ectropion.  No lesions.  Anus: Normal exam.  No lesions.  Perineum: Normal  exam.  No lesions.        Bimanual   Uterus: Normal size.  Non-tender.  Mobile.  AV.  Adnexae: No masses.  Non-tender to palpation.  Cul-de-sac: Negative for abnormality.   Assessment:   G2P1011 Patient Active Problem List   Diagnosis Date Noted   Vitamin D deficiency, unspecified 10/22/2023   Mixed hyperlipidemia 10/22/2023   HSV (herpes simplex virus) infection 05/09/2023   Obesity (BMI 30-39.9) 03/15/2017    1. Pre-op exam   2. Endometrial polyp      Plan:   Orders: No orders of the defined types were placed in this encounter.    1.  Hysteroscopy D&C  Pre-op discussions regarding Risks and Benefits of her scheduled surgery.

## 2023-12-03 ENCOUNTER — Inpatient Hospital Stay: Admission: RE | Admit: 2023-12-03 | Payer: Medicaid Other | Source: Ambulatory Visit

## 2023-12-03 ENCOUNTER — Other Ambulatory Visit: Payer: Self-pay

## 2023-12-03 ENCOUNTER — Encounter
Admission: RE | Admit: 2023-12-03 | Discharge: 2023-12-03 | Disposition: A | Discharge status: Home / Self Care | Payer: Medicaid Other | Source: Ambulatory Visit | Attending: Obstetrics and Gynecology | Admitting: Obstetrics and Gynecology

## 2023-12-03 VITALS — Ht 67.0 in | Wt 225.0 lb

## 2023-12-03 DIAGNOSIS — Z01812 Encounter for preprocedural laboratory examination: Secondary | ICD-10-CM

## 2023-12-03 HISTORY — DX: Mixed hyperlipidemia: E78.2

## 2023-12-03 HISTORY — DX: Vitamin D deficiency, unspecified: E55.9

## 2023-12-03 NOTE — Patient Instructions (Addendum)
Your procedure is scheduled on: Monday January 6  Report to the Registration Desk on the 1st floor of the [facility] To find out your arrival time, please call [phone] between 1PM - 3PM on:  Friday January 3  If your arrival time is 6:00 am, do not arrive before that time as the [facility] entrance doors do not open until 6:00 am.  REMEMBER: Instructions that are not followed completely may result in serious medical risk, up to and including death; or upon the discretion of your surgeon and anesthesiologist your surgery may need to be rescheduled.  Do not eat food after midnight the night before surgery.  No gum chewing or hard candies.   One week prior to surgery: Monday December 30  Stop Anti-inflammatories (NSAIDS) such as Advil, Aleve, Ibuprofen, Motrin, Naproxen, Naprosyn and Aspirin based products such as Excedrin, Goody's Powder, BC Powder. Stop ANY OVER THE COUNTER supplements until after surgery.  You may however, continue to take Tylenol if needed for pain up until the day of surgery.   ON THE DAY OF SURGERY DO NOT TAKE ANY MEDICATIONS  No Alcohol for 24 hours before or after surgery.  No Smoking including e-cigarettes for 24 hours before surgery.  No chewable tobacco products for at least 6 hours before surgery.  No nicotine patches on the day of surgery.  Do not use any "recreational" drugs for at least a week (preferably 2 weeks) before your surgery.  Please be advised that the combination of cocaine and anesthesia may have negative outcomes, up to and including death. If you test positive for cocaine, your surgery will be cancelled.  On the morning of surgery brush your teeth with toothpaste and water, you may rinse your mouth with mouthwash if you wish. Do not swallow any toothpaste or mouthwash.  Use CHG Soap or wipes as directed on instruction sheet.  Do not wear jewelry, make-up, hairpins, clips or nail polish.  For welded (permanent) jewelry:  bracelets, anklets, waist bands, etc.  Please have this removed prior to surgery.  If it is not removed, there is a chance that hospital personnel will need to cut it off on the day of surgery.  Do not wear lotions, powders, or perfumes.   Do not shave body hair from the neck down 48 hours before surgery.  Contact lenses, hearing aids and dentures may not be worn into surgery.  Do not bring valuables to the hospital. [facility] is not responsible for any missing/lost belongings or valuables.   Notify your doctor if there is any change in your medical condition (cold, fever, infection).  Wear comfortable clothing (specific to your surgery type) to the hospital.  After surgery, you can help prevent lung complications by doing breathing exercises.  Take deep breaths and cough every 1-2 hours.   If you are being discharged the day of surgery, you will not be allowed to drive home. You will need a responsible individual to drive you home and stay with you for 24 hours after surgery.   If you are taking public transportation, you will need to have a responsible individual with you.  Please call the Pre-admissions Testing Dept. at [phone] if you have any questions about these instructions.  Surgery Visitation Policy:  Patients having surgery or a procedure may have two visitors.  Children under the age of 66 must have an adult with them who is not the patient.  Inpatient Visitation:    Visiting hours are 7  a.m. to 8 p.m. Up to four visitors are allowed at one time in a patient room. The visitors may rotate out with other people during the day.  One visitor age 36 or older may stay with the patient overnight and must be in the room by 8 p.m.

## 2023-12-09 MED ORDER — CHLORHEXIDINE GLUCONATE 0.12 % MT SOLN
15.0000 mL | Freq: Once | OROMUCOSAL | Status: AC
  Administered 2023-12-10: 15 mL via OROMUCOSAL

## 2023-12-09 MED ORDER — ORAL CARE MOUTH RINSE: 15.0000 mL | Freq: Once | OROMUCOSAL | Status: AC

## 2023-12-09 MED ORDER — POVIDONE-IODINE 10 % EX SWAB: 2.0000 | Freq: Once | CUTANEOUS | Status: DC

## 2023-12-09 MED ORDER — LACTATED RINGERS IV SOLN: INTRAVENOUS | Status: DC

## 2023-12-09 NOTE — Anesthesia Preprocedure Evaluation (Addendum)
 Anesthesia Evaluation  Patient identified by MRN, date of birth, ID band Patient awake    Reviewed: Allergy & Precautions, H&P , NPO status , Patient's Chart, lab work & pertinent test results  Airway Mallampati: II  TM Distance: >3 FB Neck ROM: full    Dental no notable dental hx.    Pulmonary former smoker   Pulmonary exam normal        Cardiovascular negative cardio ROS Normal cardiovascular exam     Neuro/Psych negative neurological ROS  negative psych ROS   GI/Hepatic negative GI ROS, Neg liver ROS,,,  Endo/Other  negative endocrine ROS    Renal/GU      Musculoskeletal   Abdominal  (+) + obese  Peds  Hematology negative hematology ROS (+)   Anesthesia Other Findings Past Medical History: No date: History of tonsillectomy No date: Mixed hyperlipidemia No date: Vitamin D  deficiency, unspecified  Past Surgical History: No date: TONSILLECTOMY     Reproductive/Obstetrics negative OB ROS                             Anesthesia Physical Anesthesia Plan  ASA: 2  Anesthesia Plan: General   Post-op Pain Management: Ofirmev  IV (intra-op)* and Toradol  IV (intra-op)*   Induction: Intravenous  PONV Risk Score and Plan: 4 or greater and Dexamethasone , Ondansetron , Midazolam  and TIVA  Airway Management Planned: Natural Airway  Additional Equipment:   Intra-op Plan:   Post-operative Plan:   Informed Consent: I have reviewed the patients History and Physical, chart, labs and discussed the procedure including the risks, benefits and alternatives for the proposed anesthesia with the patient or authorized representative who has indicated his/her understanding and acceptance.     Dental Advisory Given  Plan Discussed with: Anesthesiologist, CRNA and Surgeon  Anesthesia Plan Comments:         Anesthesia Quick Evaluation

## 2023-12-10 ENCOUNTER — Ambulatory Visit: Payer: Self-pay | Admitting: Anesthesiology

## 2023-12-10 ENCOUNTER — Encounter: Payer: Self-pay | Admitting: Obstetrics and Gynecology

## 2023-12-10 ENCOUNTER — Encounter
Admission: RE | Disposition: A | Discharge status: Home / Self Care | Payer: Self-pay | Source: Home / Self Care | Attending: Obstetrics and Gynecology

## 2023-12-10 ENCOUNTER — Ambulatory Visit
Admission: RE | Admit: 2023-12-10 | Discharge: 2023-12-10 | Disposition: A | Discharge status: Home / Self Care | Payer: Medicaid Other | Attending: Obstetrics and Gynecology | Admitting: Obstetrics and Gynecology

## 2023-12-10 ENCOUNTER — Other Ambulatory Visit: Payer: Self-pay

## 2023-12-10 DIAGNOSIS — N938 Other specified abnormal uterine and vaginal bleeding: Secondary | ICD-10-CM | POA: Diagnosis present

## 2023-12-10 DIAGNOSIS — Z01812 Encounter for preprocedural laboratory examination: Secondary | ICD-10-CM

## 2023-12-10 DIAGNOSIS — Z87891 Personal history of nicotine dependence: Secondary | ICD-10-CM | POA: Diagnosis not present

## 2023-12-10 DIAGNOSIS — N840 Polyp of corpus uteri: Secondary | ICD-10-CM | POA: Insufficient documentation

## 2023-12-10 HISTORY — PX: HYSTEROSCOPY WITH D & C: SHX1775

## 2023-12-10 LAB — POCT PREGNANCY, URINE: Preg Test, Ur: NEGATIVE

## 2023-12-10 SURGERY — DILATATION AND CURETTAGE /HYSTEROSCOPY
Anesthesia: General | Site: Vagina

## 2023-12-10 MED ORDER — OXYCODONE HCL 5 MG PO TABS: 5.0000 mg | ORAL_TABLET | Freq: Once | ORAL | Status: DC | PRN

## 2023-12-10 MED ORDER — ACETAMINOPHEN 10 MG/ML IV SOLN: 1000.0000 mg | Freq: Once | INTRAVENOUS | Status: DC | PRN

## 2023-12-10 MED ORDER — MIDAZOLAM HCL 2 MG/2ML IJ SOLN
INTRAMUSCULAR | Status: AC
Start: 2023-12-10 — End: ?
  Filled 2023-12-10: qty 2

## 2023-12-10 MED ORDER — DEXAMETHASONE SODIUM PHOSPHATE 10 MG/ML IJ SOLN
INTRAMUSCULAR | Status: DC | PRN
  Administered 2023-12-10: 10 mg via INTRAVENOUS

## 2023-12-10 MED ORDER — ACETAMINOPHEN 10 MG/ML IV SOLN
INTRAVENOUS | Status: AC
  Filled 2023-12-10: qty 100

## 2023-12-10 MED ORDER — ONDANSETRON HCL 4 MG/2ML IJ SOLN
INTRAMUSCULAR | Status: DC | PRN
  Administered 2023-12-10: 4 mg via INTRAVENOUS

## 2023-12-10 MED ORDER — FENTANYL CITRATE (PF) 100 MCG/2ML IJ SOLN: INTRAMUSCULAR | Status: DC | PRN

## 2023-12-10 MED ORDER — SODIUM CHLORIDE 0.9 % IR SOLN
Status: DC | PRN
Start: 2023-12-10 — End: 2023-12-10
  Administered 2023-12-10: 600 mL

## 2023-12-10 MED ORDER — FENTANYL CITRATE (PF) 100 MCG/2ML IJ SOLN
INTRAMUSCULAR | Status: AC
  Filled 2023-12-10: qty 2

## 2023-12-10 MED ORDER — LIDOCAINE HCL (PF) 2 % IJ SOLN
INTRAMUSCULAR | Status: AC
  Filled 2023-12-10: qty 5

## 2023-12-10 MED ORDER — PROPOFOL 1000 MG/100ML IV EMUL
INTRAVENOUS | Status: AC
  Filled 2023-12-10: qty 100

## 2023-12-10 MED ORDER — DROPERIDOL 2.5 MG/ML IJ SOLN: 0.6250 mg | Freq: Once | INTRAMUSCULAR | Status: DC | PRN

## 2023-12-10 MED ORDER — DEXAMETHASONE SODIUM PHOSPHATE 10 MG/ML IJ SOLN
INTRAMUSCULAR | Status: AC
  Filled 2023-12-10: qty 1

## 2023-12-10 MED ORDER — ONDANSETRON HCL 4 MG/2ML IJ SOLN
INTRAMUSCULAR | Status: AC
  Filled 2023-12-10: qty 2

## 2023-12-10 MED ORDER — KETOROLAC TROMETHAMINE 30 MG/ML IJ SOLN
INTRAMUSCULAR | Status: DC | PRN
  Administered 2023-12-10: 30 mg via INTRAVENOUS

## 2023-12-10 MED ORDER — FENTANYL CITRATE (PF) 100 MCG/2ML IJ SOLN: 25.0000 ug | INTRAMUSCULAR | Status: DC | PRN

## 2023-12-10 MED ORDER — LIDOCAINE HCL (PF) 2 % IJ SOLN
INTRAMUSCULAR | Status: DC | PRN
  Administered 2023-12-10: 100 mg via INTRADERMAL

## 2023-12-10 MED ORDER — SILVER NITRATE-POT NITRATE 75-25 % EX MISC
CUTANEOUS | Status: AC
  Filled 2023-12-10: qty 10

## 2023-12-10 MED ORDER — FENTANYL CITRATE (PF) 100 MCG/2ML IJ SOLN
INTRAMUSCULAR | Status: DC | PRN
  Administered 2023-12-10: 25 ug via INTRAVENOUS
  Administered 2023-12-10: 50 ug via INTRAVENOUS
  Administered 2023-12-10: 25 ug via INTRAVENOUS

## 2023-12-10 MED ORDER — PROPOFOL 500 MG/50ML IV EMUL
INTRAVENOUS | Status: DC | PRN
  Administered 2023-12-10: 80 ug/kg/min via INTRAVENOUS

## 2023-12-10 MED ORDER — MIDAZOLAM HCL 2 MG/2ML IJ SOLN
INTRAMUSCULAR | Status: DC | PRN
  Administered 2023-12-10: 2 mg via INTRAVENOUS

## 2023-12-10 MED ORDER — DEXMEDETOMIDINE HCL IN NACL 80 MCG/20ML IV SOLN
INTRAVENOUS | Status: DC | PRN
  Administered 2023-12-10: 8 ug via INTRAVENOUS
  Administered 2023-12-10: 12 ug via INTRAVENOUS
  Administered 2023-12-10: 8 ug via INTRAVENOUS

## 2023-12-10 MED ORDER — ACETAMINOPHEN 10 MG/ML IV SOLN
INTRAVENOUS | Status: DC | PRN
  Administered 2023-12-10: 1000 mg via INTRAVENOUS

## 2023-12-10 MED ORDER — CHLORHEXIDINE GLUCONATE 0.12 % MT SOLN
OROMUCOSAL | Status: AC
Start: 2023-12-10 — End: ?
  Filled 2023-12-10: qty 15

## 2023-12-10 MED ORDER — KETOROLAC TROMETHAMINE 30 MG/ML IJ SOLN
INTRAMUSCULAR | Status: AC
  Filled 2023-12-10: qty 1

## 2023-12-10 MED ORDER — OXYCODONE HCL 5 MG/5ML PO SOLN: 5.0000 mg | Freq: Once | ORAL | Status: DC | PRN

## 2023-12-10 MED ORDER — HYDROCODONE-ACETAMINOPHEN 5-325 MG PO TABS: 1.0000 | ORAL_TABLET | Freq: Four times a day (QID) | ORAL | 0 refills | Status: DC | PRN

## 2023-12-10 SURGICAL SUPPLY — 12 items
DRSG TELFA 3X8 NADH STRL (GAUZE/BANDAGES/DRESSINGS) ×1 IMPLANT
GLOVE PI ORTHO PRO STRL 7.5 (GLOVE) ×1 IMPLANT
GOWN STRL REUS W/ TWL LRG LVL3 (GOWN DISPOSABLE) ×1 IMPLANT
KIT TURNOVER CYSTO (KITS) ×1 IMPLANT
NS IRRIG 500ML POUR BTL (IV SOLUTION) ×1 IMPLANT
PACK DNC HYST (MISCELLANEOUS) ×1 IMPLANT
PAD OB MATERNITY 11 LF (PERSONAL CARE ITEMS) ×1 IMPLANT
PAD PREP OB/GYN DISP 24X41 (PERSONAL CARE ITEMS) ×1 IMPLANT
SCRUB CHG 4% DYNA-HEX 4OZ (MISCELLANEOUS) ×1 IMPLANT
SEAL ROD LENS SCOPE MYOSURE (ABLATOR) ×1 IMPLANT
SET CYSTO W/LG BORE CLAMP LF (SET/KITS/TRAYS/PACK) IMPLANT
TOWEL OR 17X26 4PK STRL BLUE (TOWEL DISPOSABLE) ×1 IMPLANT

## 2023-12-10 NOTE — Transfer of Care (Signed)
 Immediate Anesthesia Transfer of Care Note  Patient: Mariah Watson  Procedure(s) Performed: HYSTEROSCOPY DILATATION AND CURETTAGE (Vagina )  Patient Location: PACU  Anesthesia Type:General  Level of Consciousness: drowsy and patient cooperative  Airway & Oxygen Therapy: Patient Spontanous Breathing  Post-op Assessment: Report given to RN and Post -op Vital signs reviewed and stable  Post vital signs: Reviewed and stable  Last Vitals:  Vitals Value Taken Time  BP 100/65 12/10/23 0833  Temp    Pulse 70 12/10/23 0834  Resp 18 12/10/23 0834  SpO2 98 % 12/10/23 0834  Vitals shown include unfiled device data.  Last Pain:  Vitals:   12/10/23 0625  TempSrc: Temporal  PainSc: 0-No pain         Complications: No notable events documented.

## 2023-12-10 NOTE — Op Note (Signed)
   OPERATIVE NOTE 12/10/2023 8:41 AM  PRE-OPERATIVE DIAGNOSIS:  1) DUB-endometrial polyp  POST-OPERATIVE DIAGNOSIS:  1) Same  OPERATION:  D&C  SURGEON(S): Surgeons and Role:    DEWAINE Janit Alm Lynwood, MD - Primary   ANESTHESIA: Monitor Anesthesia Care  ESTIMATED BLOOD LOSS: 10 mL  OPERATIVE FINDINGS: Thickened endometrium and polyp  SPECIMEN:  ID Type Source Tests Collected by Time Destination  1 : Endometrial Curretings GYN Endometrium Curettage SURGICAL PATHOLOGY Janit Alm Lynwood, MD 12/10/2023 [phone]     COMPLICATIONS: None  DRAINS: None  DISPOSITION: Stable to recovery room  DESCRIPTION OF PROCEDURE:      The patient was prepped and draped in the dorsal lithotomy position and placed under general anesthesia. Her bladder was emptied. The cervix was grasped with a Jacob's tenaculum. Respecting the position and curvature of her cervix, it was dilated to accommodate A hysteroscope. A very thickened endometrium was encountered. A systematic curettage was performed in all quadrants until no additional tissue was noted. The hysteroscope was replaced. What appeared to be a polypoid structure was still evident. Curettage was again performed with a larger curette. The scope was replaced and no polypoid structures remained.  The tenaculum was removed from the cervix and hemostasis was noted. The weighted speculum was removed and the patient went to recovery room in stable condition.    Alm DOROTHA Janit, M.D. 12/10/2023 8:41 AM

## 2023-12-10 NOTE — Interval H&P Note (Signed)
 History and Physical Interval Note:  12/10/2023 7:31 AM  Mariah Watson  has presented today for surgery, with the diagnosis of DUB-endometrial polyp.  The various methods of treatment have been discussed with the patient and family. After consideration of risks, benefits and other options for treatment, the patient has consented to  Procedure(s): HYSTEROSCOPY DILATATION AND CURETTAGE (N/A) as a surgical intervention.  The patient's history has been reviewed, patient examined, no change in status, stable for surgery.  I have reviewed the patient's chart and labs.  Questions were answered to the patient's satisfaction.     Alm Sar

## 2023-12-10 NOTE — H&P (Signed)
 PRE-OPERATIVE HISTORY AND PHYSICAL EXAM  PCP:  Miki Kins, FNP Subjective:   HPI:  Mariah Watson is a 33 y.o. G2P1011.  Patient's last menstrual period was 10/18/2023.  She presents today for a pre-op discussion and PE.  She has the following symptoms: Dysfunctional uterine bleeding-ultrasound reveals endometrial polyp  Review of Systems:   Constitutional: Denied constitutional symptoms, night sweats, recent illness, fatigue, fever, insomnia and weight loss.  Eyes: Denied eye symptoms, eye pain, photophobia, vision change and visual disturbance.  Ears/Nose/Throat/Neck: Denied ear, nose, throat or neck symptoms, hearing loss, nasal discharge, sinus congestion and sore throat.  Cardiovascular: Denied cardiovascular symptoms, arrhythmia, chest pain/pressure, edema, exercise intolerance, orthopnea and palpitations.  Respiratory: Denied pulmonary symptoms, asthma, pleuritic pain, productive sputum, cough, dyspnea and wheezing.  Gastrointestinal: Denied, gastro-esophageal reflux, melena, nausea and vomiting.  Genitourinary: See HPI for additional information.  Musculoskeletal: Denied musculoskeletal symptoms, stiffness, swelling, muscle weakness and myalgia.  Dermatologic: Denied dermatology symptoms, rash and scar.  Neurologic: Denied neurology symptoms, dizziness, headache, neck pain and syncope.  Psychiatric: Denied psychiatric symptoms, anxiety and depression.  Endocrine: Denied endocrine symptoms including hot flashes and night sweats.   OB History  Gravida Para Term Preterm AB Living  2 1 1   1 1   SAB IAB Ectopic Multiple Live Births          1    # Outcome Date GA Lbr Len/2nd Weight Sex Type Anes PTL Lv  2 AB           1 Term             Past Medical History:  Diagnosis Date   History of tonsillectomy     Past Surgical History:  Procedure Laterality Date   TONSILLECTOMY        SOCIAL HISTORY:  Social History   Tobacco Use  Smoking Status Former    Current packs/day: 0.00   Types: Cigarettes   Quit date: 05/08/2021   Years since quitting: 2.5  Smokeless Tobacco Never   Social History   Substance and Sexual Activity  Alcohol Use Not Currently    Social History   Substance and Sexual Activity  Drug Use Not Currently   Types: Marijuana    Family History  Problem Relation Age of Onset   Diabetes Mellitus II Father    Liver disease Maternal Grandmother    Cancer Maternal Grandfather    Heart Problems Paternal Grandmother    Lung cancer Paternal Grandfather     ALLERGIES:  Shellfish allergy  MEDS:   No current outpatient medications on file prior to visit.   No current facility-administered medications on file prior to visit.    No orders of the defined types were placed in this encounter.    Physical examination BP 102/71   Pulse 81   Ht 5\' 7"  (1.702 m)   Wt 229 lb 4.8 oz (104 kg)   LMP 10/18/2023   BMI 35.91 kg/m   General NAD, Conversant  HEENT Atraumatic; Op clear with mmm.  Normo-cephalic.  Anicteric sclerae  Thyroid/Neck Smooth without nodularity or enlargement. Normal ROM.  Neck Supple.  Skin No rashes, lesions or ulceration. Normal palpated skin turgor. No nodularity.  Breasts: No masses or discharge.  Symmetric.  No axillary adenopathy.  Lungs: Clear to auscultation.No rales or wheezes. Normal Respiratory effort, no retractions.  Heart: NSR.  No murmurs or rubs appreciated. No peripheral edema  Abdomen: Soft.  Non-tender.  No masses.  No HSM. No hernia  Extremities: Moves all appropriately.  Normal ROM for age. No lymphadenopathy.  Neuro: Oriented to PPT.  Normal mood. Normal affect.     Pelvic:   Vulva: Normal appearance.  No lesions.  Vagina: No lesions or abnormalities noted.  Support: Normal pelvic support.  Urethra No masses tenderness or scarring.  Meatus Normal size without lesions or prolapse.  Cervix: Normal ectropion.  No lesions.  Anus: Normal exam.  No lesions.  Perineum: Normal  exam.  No lesions.        Bimanual   Uterus: Normal size.  Non-tender.  Mobile.  AV.  Adnexae: No masses.  Non-tender to palpation.  Cul-de-sac: Negative for abnormality.   Assessment:   G2P1011 Patient Active Problem List   Diagnosis Date Noted   Vitamin D deficiency, unspecified 10/22/2023   Mixed hyperlipidemia 10/22/2023   HSV (herpes simplex virus) infection 05/09/2023   Obesity (BMI 30-39.9) 03/15/2017    1. Pre-op exam   2. Endometrial polyp      Plan:   Orders: No orders of the defined types were placed in this encounter.    1.  Hysteroscopy D&C  Pre-op discussions regarding Risks and Benefits of her scheduled surgery.

## 2023-12-10 NOTE — Progress Notes (Signed)
 Arrival [phone]

## 2023-12-10 NOTE — Anesthesia Postprocedure Evaluation (Signed)
 Anesthesia Post Note  Patient: Mariah Watson  Procedure(s) Performed: HYSTEROSCOPY DILATATION AND CURETTAGE (Vagina )  Patient location during evaluation: PACU Anesthesia Type: General Level of consciousness: awake and alert Pain management: pain level controlled Vital Signs Assessment: post-procedure vital signs reviewed and stable Respiratory status: spontaneous breathing, nonlabored ventilation and respiratory function stable Cardiovascular status: blood pressure returned to baseline and stable Postop Assessment: no apparent nausea or vomiting Anesthetic complications: no   No notable events documented.   Last Vitals:  Vitals:   12/10/23 0915 12/10/23 0953  BP:  107/69  Pulse: 69 67  Resp: 14 18  Temp: (!) 36.1 C 36.5 C  SpO2: 99% 100%    Last Pain:  Vitals:   12/10/23 0953  TempSrc:   PainSc: 0-No pain                 Camellia Merilee Louder

## 2023-12-11 ENCOUNTER — Encounter: Payer: Self-pay | Admitting: Obstetrics and Gynecology

## 2023-12-11 LAB — SURGICAL PATHOLOGY

## 2023-12-12 ENCOUNTER — Other Ambulatory Visit: Payer: Self-pay | Admitting: Medical Genetics

## 2023-12-18 ENCOUNTER — Encounter: Payer: Medicaid Other | Admitting: Obstetrics and Gynecology

## 2023-12-19 ENCOUNTER — Encounter: Payer: Medicaid Other | Admitting: Obstetrics and Gynecology

## 2023-12-19 DIAGNOSIS — Z9889 Other specified postprocedural states: Secondary | ICD-10-CM

## 2024-01-01 ENCOUNTER — Ambulatory Visit (INDEPENDENT_AMBULATORY_CARE_PROVIDER_SITE_OTHER): Payer: Medicaid Other | Admitting: Obstetrics and Gynecology

## 2024-01-01 ENCOUNTER — Encounter: Payer: Self-pay | Admitting: Obstetrics and Gynecology

## 2024-01-01 VITALS — BP 115/79 | HR 79 | Ht 67.0 in | Wt 222.6 lb

## 2024-01-01 DIAGNOSIS — R6882 Decreased libido: Secondary | ICD-10-CM

## 2024-01-01 DIAGNOSIS — Z4889 Encounter for other specified surgical aftercare: Secondary | ICD-10-CM

## 2024-01-01 DIAGNOSIS — Z9889 Other specified postprocedural states: Secondary | ICD-10-CM

## 2024-01-01 NOTE — Progress Notes (Signed)
Patient presents for 3 week postop follow-up following D&C. She states no bleeding, pain or urinary concerns.

## 2024-01-01 NOTE — Progress Notes (Signed)
HPI:      [name] is a 33 y.o. G2P1011 who LMP was Patient's last menstrual period was 11/17/2023 (exact date).  Subjective:   She presents today 3 weeks after D&C hysteroscopy for endometrial polyp.  Pathology is consistent with benign endometrial polyp.  She is not having any further bleeding or pain at this time. She does complain of a noted decrease in sensation of her clitoris over the last several months.  She has no idea why this has occurred. She is having normal regular menses and does not want to do anything for birth control at this time because she has not sexually active with intercourse.    Hx: The following portions of the patient's history were reviewed and updated as appropriate:             She  has a past medical history of History of tonsillectomy, Mixed hyperlipidemia, and Vitamin D deficiency, unspecified. She does not have any pertinent problems on file. She  has a past surgical history that includes Tonsillectomy and Hysteroscopy with D & C (N/A, 12/10/2023). Her family history includes Cancer in her maternal grandfather; Diabetes Mellitus II in her father; Heart Problems in her paternal grandmother; Liver disease in her maternal grandmother; Lung cancer in her paternal grandfather. She  reports that she quit smoking about 2 years ago. Her smoking use included cigarettes. She has never used smokeless tobacco. She reports that she does not currently use alcohol. She reports that she does not currently use drugs after having used the following drugs: Marijuana. She currently has no medications in their medication list. She is allergic to shellfish allergy.       Review of Systems:  Review of Systems  Constitutional: Denied constitutional symptoms, night sweats, recent illness, fatigue, fever, insomnia and weight loss.  Eyes: Denied eye symptoms, eye pain, photophobia, vision change and visual disturbance.  Ears/Nose/Throat/Neck: Denied ear, nose, throat or neck  symptoms, hearing loss, nasal discharge, sinus congestion and sore throat.  Cardiovascular: Denied cardiovascular symptoms, arrhythmia, chest pain/pressure, edema, exercise intolerance, orthopnea and palpitations.  Respiratory: Denied pulmonary symptoms, asthma, pleuritic pain, productive sputum, cough, dyspnea and wheezing.  Gastrointestinal: Denied, gastro-esophageal reflux, melena, nausea and vomiting.  Genitourinary: Denied genitourinary symptoms including symptomatic vaginal discharge, pelvic relaxation issues, and urinary complaints.  Musculoskeletal: Denied musculoskeletal symptoms, stiffness, swelling, muscle weakness and myalgia.  Dermatologic: Denied dermatology symptoms, rash and scar.  Neurologic: Denied neurology symptoms, dizziness, headache, neck pain and syncope.  Psychiatric: Denied psychiatric symptoms, anxiety and depression.  Endocrine: Denied endocrine symptoms including hot flashes and night sweats.   Meds:   No current outpatient medications on file prior to visit.   No current facility-administered medications on file prior to visit.      Objective:     Vitals:   01/01/24 1021  BP: 115/79  Pulse: 79   Filed Weights   01/01/24 1021  Weight: 222 lb 9.6 oz (101 kg)                        Assessment:    G2P1011 Patient Active Problem List   Diagnosis Date Noted   Vitamin D deficiency, unspecified 10/22/2023   Mixed hyperlipidemia 10/22/2023   HSV (herpes simplex virus) infection 05/09/2023   Obesity (BMI 30-39.9) 03/15/2017     1. Postoperative state   2. Decreased libido     Endometrial polyp diagnosed-benign.  Not exactly decreased libido but decreased sensation of her clitoris.  Plan:            1.  We discussed her endometrial polyp.  She plans to cycle without hormonal intervention.  2.  Decreased clitoral sensation discussed.  Refer to urogynecology for possible ideas or answers. Orders No orders of the defined types were placed  in this encounter.   No orders of the defined types were placed in this encounter.     F/U  Return for Annual Physical.  Elonda Husky, M.D. 01/01/2024 11:15 AM

## 2024-01-22 ENCOUNTER — Ambulatory Visit: Payer: Medicaid Other | Admitting: Family

## 2024-04-01 ENCOUNTER — Encounter: Payer: Self-pay | Admitting: Obstetrics and Gynecology

## 2024-04-01 ENCOUNTER — Ambulatory Visit: Payer: Medicaid Other | Admitting: Obstetrics and Gynecology

## 2024-04-01 VITALS — BP 102/73 | HR 85 | Ht 66.5 in | Wt 218.8 lb

## 2024-04-01 DIAGNOSIS — N9089 Other specified noninflammatory disorders of vulva and perineum: Secondary | ICD-10-CM | POA: Diagnosis not present

## 2024-04-01 DIAGNOSIS — M6289 Other specified disorders of muscle: Secondary | ICD-10-CM | POA: Diagnosis not present

## 2024-04-01 DIAGNOSIS — M62838 Other muscle spasm: Secondary | ICD-10-CM

## 2024-04-01 DIAGNOSIS — R350 Frequency of micturition: Secondary | ICD-10-CM | POA: Diagnosis not present

## 2024-04-01 LAB — POCT URINALYSIS DIPSTICK
Bilirubin, UA: NEGATIVE
Blood, UA: NEGATIVE
Glucose, UA: NEGATIVE
Ketones, UA: NEGATIVE
Leukocytes, UA: NEGATIVE
Nitrite, UA: NEGATIVE
Protein, UA: NEGATIVE
Spec Grav, UA: 1.015 (ref 1.010–1.025)
Urobilinogen, UA: 0.2 U/dL
pH, UA: 7.5 (ref 5.0–8.0)

## 2024-04-01 MED ORDER — ESTRADIOL 0.1 MG/GM VA CREA: 0.5000 g | TOPICAL_CREAM | VAGINAL | 11 refills | Status: AC

## 2024-04-01 NOTE — Patient Instructions (Addendum)
 Please start pelvic floor PT.   You can also use the cream or coconut oil or vitamin e cream to assist in clitoral stimulation.   You may also want to try a clitoral stimulation sucking device. This can help nerve stimulation around the clitoris and can promote more blood flow to the area.   If the spasms in the pelvic floor get worse, we can try an oral muscle relaxant.

## 2024-04-01 NOTE — Progress Notes (Signed)
 [facility] New Patient Evaluation and Consultation  Referring Provider: Zenobia Hila, MD PCP: Trenda Frisk, FNP Date of Service: 04/01/2024  SUBJECTIVE Chief Complaint: New Patient (Initial Visit) Mariah Watson is a 33 y.o. female is here for vaginal issues.)  History of Present Illness: Mariah Watson is a 33 y.o. Black or African-American female seen in consultation at the request of Dr. Luster Salters for evaluation of pelvic pain/lack of clitoral sensation.    Review of records significant for: Dr. Luster Salters did a D&C and Hysteroscopy on 12/10/23. At the follow up with Dr. Luster Salters she reported decreased sensation in her clitoris.   Urinary Symptoms: Does not leak urine.    Day time voids 5.  Nocturia: 1 times per night to void. Voiding dysfunction:  empties bladder well.  Patient does not use a catheter to empty bladder.  When urinating, patient feels she has no difficulties Drinks: 2 cups Coffee, 4 cups of Water, juice,  per day  UTIs: 0 UTI's in the last year.   Denies history of blood in urine, kidney or bladder stones, pyelonephritis, bladder cancer, and kidney cancer No results found for the last 90 days.   Pelvic Organ Prolapse Symptoms:                  Patient Denies a feeling of a bulge the vaginal area.   Bowel Symptom: Bowel movements: 2 time(s) per day Stool consistency: soft  Straining: no.  Splinting: no.  Incomplete evacuation: no.  Patient Denies accidental bowel leakage / fecal incontinence Bowel regimen: none Last colonoscopy: N/a HM Colonoscopy   This patient has no relevant Health Maintenance data.     Sexual Function Sexually active: no.  Sexual orientation: Straight Pain with sex: No  Pelvic Pain Denies pelvic pain    Past Medical History:  Past Medical History:  Diagnosis Date   History of tonsillectomy    Mixed hyperlipidemia    Vitamin D  deficiency, unspecified      Past Surgical History:   Past Surgical  History:  Procedure Laterality Date   HYSTEROSCOPY WITH D & C N/A 12/10/2023   Procedure: HYSTEROSCOPY DILATATION AND CURETTAGE;  Surgeon: Zenobia Hila, MD;  Location: [facility];  Service: Gynecology;  Laterality: N/A;   TONSILLECTOMY       Past OB/GYN History: [id number] Vaginal deliveries: 1,  Forceps/ Vacuum deliveries: 0, Cesarean section: 0 Menopausal: No, LMP Patient's last menstrual period was 03/17/2024. Contraception: N/a. Last pap smear was June 2024.  Any history of abnormal pap smears: no. HM PAP   This patient has no relevant Health Maintenance data.     Medications: Patient has a current medication list which includes the following prescription(s): [START ON 04/03/2024] estradiol .   Allergies: Patient is allergic to shellfish allergy.   Social History:  Social History   Tobacco Use   Smoking status: Former    Current packs/day: 0.00    Types: Cigarettes    Quit date: 05/08/2021    Years since quitting: 2.9   Smokeless tobacco: Never  Vaping Use   Vaping status: Never Used  Substance Use Topics   Alcohol use: Not Currently   Drug use: Not Currently    Types: Marijuana    Relationship status: single Patient lives with her son.   Patient is not employed. Regular exercise: No History of abuse: No  Family History:   Family History  Problem Relation Age of Onset   Diabetes Mellitus II Father  Liver disease Maternal Grandmother    Cancer Maternal Grandfather    Heart Problems Paternal Grandmother    Lung cancer Paternal Grandfather      Review of Systems: Review of Systems  Constitutional:  Positive for malaise/fatigue. Negative for chills and fever.  Respiratory:  Negative for cough and shortness of breath.   Cardiovascular:  Negative for chest pain and palpitations.  Gastrointestinal:  Negative for abdominal pain, blood in stool, constipation and diarrhea.  Skin:  Negative for rash.  Neurological:  Negative for weakness.  Endo/Heme/Allergies:   Bruises/bleeds easily.  Psychiatric/Behavioral:  Negative for depression and suicidal ideas.      OBJECTIVE Physical Exam: Vitals:   04/01/24 0959  BP: 102/73  Pulse: 85  Weight: 218 lb 12.8 oz (99.2 kg)  Height: 5' 6.5" (1.689 m)    Physical Exam Vitals reviewed. Chaperone present: Patient defers chaperone for pelvic exam..  Constitutional:      Appearance: Normal appearance.  Pulmonary:     Effort: Pulmonary effort is normal.  Abdominal:     Palpations: Abdomen is soft.     Hernia: There is no hernia in the left inguinal area or right inguinal area.  Genitourinary:    Pubic Area: No rash.      Labia:        Right: No rash, tenderness, lesion or injury.        Left: No rash, tenderness, lesion or injury.      Urethra: No prolapse, urethral pain, urethral swelling or urethral lesion.       Comments: Patient has sensation on the left levator and obturator region, but on the right side she can only feel pressure and little to no sensation along the pelvic walls. She also has significant decreased and lack of sensation to the clitoris.  Neurological:     General: No focal deficit present.     Mental Status: She is alert and oriented to person, place, and time.  Psychiatric:        Mood and Affect: Mood normal.        Behavior: Behavior normal. Behavior is cooperative.        Thought Content: Thought content normal.      GU / Detailed Urogynecologic Evaluation:  Pelvic Exam: Normal external female genitalia; Bartholin's and Skene's glands normal in appearance; urethral meatus normal in appearance, no urethral masses or discharge.   CST: negative  Speculum exam reveals normal vaginal mucosa without atrophy. Cervix normal appearance but when touched she has limited/no sensation. Uterus normal single, nontender. Adnexa normal adnexa.    With apex supported, anterior compartment defect was reduced  Pelvic floor strength III/V  Pelvic floor musculature: Right levator  non-tender lack of sensation noted on exam, Right obturator non-tender lack of sensation noted on exam, Left levator non-tender, Left obturator non-tender  POP-Q:   Deferred. No prolapse   Rectal Exam:  Normal sphincter tone, no distal rectocele, enterocoele not present, no rectal masses, no sign of dyssynergia when asking the patient to bear down.  Post-Void Residual (PVR) by Bladder Scan: In order to evaluate bladder emptying, we discussed obtaining a postvoid residual and patient agreed to this procedure.  Procedure: The ultrasound unit was placed on the patient's abdomen in the suprapubic region after the patient had voided.    Post Void Residual - 04/01/24 1012       Post Void Residual   Post Void Residual 19 mL  Laboratory Results: Lab Results  Component Value Date   COLORU yellow 04/01/2024   CLARITYU clear 04/01/2024   GLUCOSEUR Negative 04/01/2024   BILIRUBINUR negative 04/01/2024   KETONESU negative 04/01/2024   SPECGRAV 1.015 04/01/2024   RBCUR negative 04/01/2024   PHUR 7.5 04/01/2024   PROTEINUR Negative 04/01/2024   UROBILINOGEN 0.2 04/01/2024   LEUKOCYTESUR Negative 04/01/2024    Lab Results  Component Value Date   CREATININE 0.78 10/04/2023   CREATININE 0.69 12/01/2022   CREATININE 0.78 10/03/2019    Lab Results  Component Value Date   HGBA1C 5.3 10/04/2023    Lab Results  Component Value Date   HGB 12.4 10/04/2023     ASSESSMENT AND PLAN Ms. Malachi is a 33 y.o. with:  1. Pelvic floor dysfunction in female   2. Levator spasm   3. Clitoral irritation   4. Urinary frequency    Patient has pelvic floor spasms, decreased sensation on manual exam, and lack of clitoral sensation. There could be nerve components related to pudendal nerve, pelvic floor tension, or spinal involvement.  Will send patient for pelvic floor physical therapy. She has been dealing with the decreased sensation since October of 2023. I am hopeful that  this combined with some vaginal estrogen cream will be helpful for her lack of sensation and pelvic floor dysfunction.  Patient does not have express clitoral irritation, just a lack of sensation. Potentially, dry needling to the pelvic floor or possible pelvic floor injections in office can be considered. I encouraged patient to follow up with me in about 6 months to see where we are at sensation wise/pelvic floor dysfunction wise.  Not bothersome for patient   Patient to follow up in 6 months. Encouraged her to message or call if things are worsening or not improving. I also encouraged her to not be discouraged if it takes a while to see pelvic floor PT.     Emila Steinhauser G Devika Dragovich, NP

## 2024-08-05 ENCOUNTER — Ambulatory Visit: Admitting: Family

## 2024-08-05 ENCOUNTER — Ambulatory Visit

## 2024-08-11 ENCOUNTER — Ambulatory Visit: Attending: Obstetrics and Gynecology

## 2024-08-18 ENCOUNTER — Ambulatory Visit

## 2024-08-25 ENCOUNTER — Ambulatory Visit

## 2024-09-01 ENCOUNTER — Ambulatory Visit

## 2024-09-08 ENCOUNTER — Ambulatory Visit

## 2024-09-11 ENCOUNTER — Encounter: Payer: Self-pay | Admitting: Family

## 2024-09-11 ENCOUNTER — Ambulatory Visit: Admitting: Family

## 2024-09-11 VITALS — BP 120/80 | HR 86 | Ht 67.0 in | Wt 223.8 lb

## 2024-09-11 DIAGNOSIS — Z0130 Encounter for examination of blood pressure without abnormal findings: Secondary | ICD-10-CM

## 2024-09-11 DIAGNOSIS — L659 Nonscarring hair loss, unspecified: Secondary | ICD-10-CM

## 2024-09-11 DIAGNOSIS — R7303 Prediabetes: Secondary | ICD-10-CM

## 2024-09-11 DIAGNOSIS — E782 Mixed hyperlipidemia: Secondary | ICD-10-CM

## 2024-09-11 DIAGNOSIS — E538 Deficiency of other specified B group vitamins: Secondary | ICD-10-CM

## 2024-09-11 DIAGNOSIS — E669 Obesity, unspecified: Secondary | ICD-10-CM

## 2024-09-11 DIAGNOSIS — G588 Other specified mononeuropathies: Secondary | ICD-10-CM

## 2024-09-11 DIAGNOSIS — R5383 Other fatigue: Secondary | ICD-10-CM

## 2024-09-11 DIAGNOSIS — E559 Vitamin D deficiency, unspecified: Secondary | ICD-10-CM

## 2024-09-11 DIAGNOSIS — N9089 Other specified noninflammatory disorders of vulva and perineum: Secondary | ICD-10-CM

## 2024-09-11 DIAGNOSIS — R0789 Other chest pain: Secondary | ICD-10-CM

## 2024-09-11 DIAGNOSIS — R102 Pelvic and perineal pain unspecified side: Secondary | ICD-10-CM

## 2024-09-11 NOTE — Progress Notes (Signed)
 Established Patient Office Visit  Subjective:  Patient ID: Mariah Watson, female    DOB: 1991/07/16  Age: 33 y.o. MRN: [id number]  Chief Complaint  Patient presents with   Follow-up    Right sided headaches with swelling    Patient here today with multiple concerns: 1) She recently found out that her dad is not her biological father, and that her biological father had multiple health problems.  He passed away at 73 with these, after a heart transplant and multiple complications.  She is concerned about the possibility of having issues herself. Also having some chest pressure and says that she can't ever feel her   2) She also has been having issues with low pelvic pain/numbness, and decreased clitoral sensation. These have been happening since her procedure to deal with her polyp. She Did have an appointment with urogyn, but they were unable to help her with this.  She was also set up with Pelvic floor PT, but she didn't manage to go.   3) She has been losing her hair, unsure of what could be causing this.    Needs labs    No other concerns at this time.   Past Medical History:  Diagnosis Date   History of tonsillectomy    Mixed hyperlipidemia    Vitamin D  deficiency, unspecified     Past Surgical History:  Procedure Laterality Date   HYSTEROSCOPY WITH D & C N/A 12/10/2023   Procedure: HYSTEROSCOPY DILATATION AND CURETTAGE;  Surgeon: Janit Alm Agent, MD;  Location: [facility];  Service: Gynecology;  Laterality: N/A;   TONSILLECTOMY      Social History   Socioeconomic History   Marital status: Single    Spouse name: Not on file   Number of children: Not on file   Years of education: Not on file   Highest education level: Not on file  Occupational History   Not on file  Tobacco Use   Smoking status: Former    Current packs/day: 0.00    Types: Cigarettes    Quit date: 05/08/2021    Years since quitting: 3.3   Smokeless tobacco: Never  Vaping Use   Vaping status:  Never Used  Substance and Sexual Activity   Alcohol use: Not Currently   Drug use: Not Currently    Types: Marijuana   Sexual activity: Not Currently    Birth control/protection: None  Other Topics Concern   Not on file  Social History Narrative   Not on file   Social Drivers of Health   Financial Resource Strain: Not on file  Food Insecurity: Not on file  Transportation Needs: Not on file  Physical Activity: Not on file  Stress: Not on file  Social Connections: Not on file  Intimate Partner Violence: Not At Risk (05/09/2023)   Humiliation, Afraid, Rape, and Kick questionnaire    Fear of Current or Ex-Partner: No    Emotionally Abused: No    Physically Abused: No    Sexually Abused: No    Family History  Problem Relation Age of Onset   Diabetes Mellitus II Father    Liver disease Maternal Grandmother    Cancer Maternal Grandfather    Heart Problems Paternal Grandmother    Lung cancer Paternal Grandfather     Allergies  Allergen Reactions   Shellfish Allergy Anaphylaxis    Review of Systems  Cardiovascular:  Positive for palpitations.  Genitourinary:        Pelvic pain Loss of clitoral sensation  Skin:        Hair loss  All other systems reviewed and are negative.      Objective:   BP 120/80   Pulse 86   Ht 5' 7 (1.702 m)   Wt 223 lb 12.8 oz (101.5 kg)   SpO2 98%   BMI 35.05 kg/m   Vitals:   09/11/24 0939  BP: 120/80  Pulse: 86  Height: 5' 7 (1.702 m)  Weight: 223 lb 12.8 oz (101.5 kg)  SpO2: 98%  BMI (Calculated): 35.04    Physical Exam Vitals and nursing note reviewed.  Constitutional:      Appearance: Normal appearance. She is normal weight.  HENT:     Head: Normocephalic.  Eyes:     Extraocular Movements: Extraocular movements intact.     Conjunctiva/sclera: Conjunctivae normal.     Pupils: Pupils are equal, round, and reactive to light.  Cardiovascular:     Rate and Rhythm: Normal rate.  Pulmonary:     Effort: Pulmonary effort  is normal.  Neurological:     General: No focal deficit present.     Mental Status: She is alert and oriented to person, place, and time. Mental status is at baseline.  Psychiatric:        Mood and Affect: Mood normal.        Behavior: Behavior normal.        Thought Content: Thought content normal.      No results found for any visits on 09/11/24.  No results found for this or any previous visit (from the past 2160 hours).     Assessment & Plan Patchy loss of hair Vitamin D  deficiency, unspecified Other fatigue B12 deficiency due to diet Checking labs today.  Will continue supplements as needed.   - Vitamin D  - Vitamin B12 - TSH  Mixed hyperlipidemia Checking labs today.  Continue current therapy for lipid control. Will modify as needed based on labwork results.   -CMP w/eGFR -Lipid Panel  Obesity (BMI 30-39.9) Continue current meds.  Will adjust as needed based on results.  The patient is asked to make an attempt to improve diet and exercise patterns to aid in medical management of this problem. Addressed importance of increasing and maintaining water intake.   Prediabetes A1C Continues to be in prediabetic ranges.  Will reassess at follow up after next lab check.  Patient counseled on dietary choices and verbalized understanding.   -CBC w/Diff -CMP w/eGFR -Hemoglobin A1C  Chest pressure EKG In office today WNL.  Will set pt up for referral to Cardiology given family history.   Reassess at follow up.   Neuralgia of right pudendal nerve Other specified noninflammatory disorders of vulva and perineum Perineal pain Setting patient up for referral to Pelvic Floor PT .  Will defer to them for further treatment changes.  Reassess at follow up. If not benefiting, will consider further referrals.     Return in about 2 weeks (around 09/25/2024).   Total time spent: 30 minutes  ALAN CHRISTELLA ARRANT, FNP  09/11/2024   This document may have been  prepared by [facility] Voice Recognition software and as such may include unintentional dictation errors.

## 2024-09-12 ENCOUNTER — Ambulatory Visit: Payer: Self-pay

## 2024-09-12 LAB — VITAMIN D 25 HYDROXY (VIT D DEFICIENCY, FRACTURES): Vit D, 25-Hydroxy: 29.2 ng/mL — ABNORMAL LOW (ref 30.0–100.0)

## 2024-09-12 LAB — CMP14+EGFR
ALT: 20 IU/L (ref 0–32)
AST: 27 IU/L (ref 0–40)
Albumin: 4.4 g/dL (ref 3.9–4.9)
Alkaline Phosphatase: 47 IU/L (ref 41–116)
BUN/Creatinine Ratio: 11 (ref 9–23)
BUN: 10 mg/dL (ref 6–20)
Bilirubin Total: 0.4 mg/dL (ref 0.0–1.2)
CO2: 21 mmol/L (ref 20–29)
Calcium: 9.5 mg/dL (ref 8.7–10.2)
Chloride: 104 mmol/L (ref 96–106)
Creatinine, Ser: 0.92 mg/dL (ref 0.57–1.00)
Globulin, Total: 2.9 g/dL (ref 1.5–4.5)
Glucose: 85 mg/dL (ref 70–99)
Potassium: 4.3 mmol/L (ref 3.5–5.2)
Sodium: 138 mmol/L (ref 134–144)
Total Protein: 7.3 g/dL (ref 6.0–8.5)
eGFR: 84 mL/min/1.73 (ref >59)

## 2024-09-12 LAB — IRON,TIBC AND FERRITIN PANEL
Ferritin: 61 ng/mL (ref 15–150)
Iron Saturation: 41 % (ref 15–55)
Iron: 108 ug/dL (ref 27–159)
Total Iron Binding Capacity: 264 ug/dL (ref 250–450)
UIBC: 156 ug/dL (ref 131–425)

## 2024-09-12 LAB — TSH+T4F+T3FREE
Free T4: 1.19 ng/dL (ref 0.82–1.77)
T3, Free: 2.7 pg/mL (ref 2.0–4.4)
TSH: 0.824 u[IU]/mL (ref 0.450–4.500)

## 2024-09-12 LAB — CBC WITH DIFFERENTIAL/PLATELET
Basophils Absolute: 0 x10E3/uL (ref 0.0–0.2)
Basos: 1 %
EOS (ABSOLUTE): 0.3 x10E3/uL (ref 0.0–0.4)
Eos: 6 %
Hematocrit: 38.6 % (ref 34.0–46.6)
Hemoglobin: 12.7 g/dL (ref 11.1–15.9)
Immature Grans (Abs): 0 x10E3/uL (ref 0.0–0.1)
Immature Granulocytes: 0 %
Lymphocytes Absolute: 1.5 x10E3/uL (ref 0.7–3.1)
Lymphs: 38 %
MCH: 29.6 pg (ref 26.6–33.0)
MCHC: 32.9 g/dL (ref 31.5–35.7)
MCV: 90 fL (ref 79–97)
Monocytes Absolute: 0.4 x10E3/uL (ref 0.1–0.9)
Monocytes: 10 %
Neutrophils Absolute: 1.8 x10E3/uL (ref 1.4–7.0)
Neutrophils: 45 %
Platelets: 339 x10E3/uL (ref 150–450)
RBC: 4.29 x10E6/uL (ref 3.77–5.28)
RDW: 12.7 % (ref 11.7–15.4)
WBC: 4.1 x10E3/uL (ref 3.4–10.8)

## 2024-09-12 LAB — LIPID PANEL
Chol/HDL Ratio: 3.1 ratio (ref 0.0–4.4)
Cholesterol, Total: 195 mg/dL (ref 100–199)
HDL: 63 mg/dL (ref >39)
LDL Chol Calc (NIH): 121 mg/dL — ABNORMAL HIGH (ref 0–99)
Triglycerides: 57 mg/dL (ref 0–149)
VLDL Cholesterol Cal: 11 mg/dL (ref 5–40)

## 2024-09-12 LAB — HEMOGLOBIN A1C
Est. average glucose Bld gHb Est-mCnc: 111 mg/dL
Hgb A1c MFr Bld: 5.5 % (ref 4.8–5.6)

## 2024-09-12 LAB — VITAMIN B12: Vitamin B-12: 564 pg/mL (ref 232–1245)

## 2024-09-14 ENCOUNTER — Encounter: Payer: Self-pay | Admitting: Family

## 2024-09-14 NOTE — Assessment & Plan Note (Signed)
 Continue current meds.  Will adjust as needed based on results.  The patient is asked to make an attempt to improve diet and exercise patterns to aid in medical management of this problem. Addressed importance of increasing and maintaining water  intake.

## 2024-09-14 NOTE — Assessment & Plan Note (Signed)
 Checking labs today.  Will continue supplements as needed.   - Vitamin D  - Vitamin B12 - TSH

## 2024-09-14 NOTE — Assessment & Plan Note (Signed)
 Checking labs today.  Continue current therapy for lipid control. Will modify as needed based on labwork results.   -CMP w/eGFR -Lipid Panel

## 2024-09-15 ENCOUNTER — Ambulatory Visit

## 2024-09-22 ENCOUNTER — Ambulatory Visit

## 2024-09-24 ENCOUNTER — Other Ambulatory Visit: Payer: Self-pay | Admitting: Medical Genetics

## 2024-09-24 DIAGNOSIS — Z006 Encounter for examination for normal comparison and control in clinical research program: Secondary | ICD-10-CM

## 2024-09-25 ENCOUNTER — Ambulatory Visit: Admitting: Family

## 2024-09-29 ENCOUNTER — Ambulatory Visit

## 2024-10-01 ENCOUNTER — Ambulatory Visit: Admitting: Family

## 2024-10-06 ENCOUNTER — Ambulatory Visit

## 2024-11-18 ENCOUNTER — Encounter: Admitting: Family
